# Patient Record
Sex: Male | Born: 1960 | Race: White | Hispanic: No | Marital: Single | Smoking: Former smoker
Health system: Southern US, Community
[De-identification: ages and names within clinical notes are randomized; demographics above are authoritative.]

## PROBLEM LIST (undated history)

## (undated) DIAGNOSIS — M1990 Unspecified osteoarthritis, unspecified site: Secondary | ICD-10-CM

## (undated) DIAGNOSIS — M069 Rheumatoid arthritis, unspecified: Secondary | ICD-10-CM

## (undated) DIAGNOSIS — I361 Nonrheumatic tricuspid (valve) insufficiency: Secondary | ICD-10-CM

## (undated) DIAGNOSIS — G608 Other hereditary and idiopathic neuropathies: Secondary | ICD-10-CM

## (undated) DIAGNOSIS — E785 Hyperlipidemia, unspecified: Secondary | ICD-10-CM

## (undated) DIAGNOSIS — Z87891 Personal history of nicotine dependence: Secondary | ICD-10-CM

## (undated) DIAGNOSIS — J449 Chronic obstructive pulmonary disease, unspecified: Secondary | ICD-10-CM

## (undated) DIAGNOSIS — F1210 Cannabis abuse, uncomplicated: Secondary | ICD-10-CM

## (undated) DIAGNOSIS — B182 Chronic viral hepatitis C: Secondary | ICD-10-CM

## (undated) DIAGNOSIS — F1411 Cocaine abuse, in remission: Secondary | ICD-10-CM

## (undated) DIAGNOSIS — I739 Peripheral vascular disease, unspecified: Secondary | ICD-10-CM

## (undated) DIAGNOSIS — I440 Atrioventricular block, first degree: Secondary | ICD-10-CM

## (undated) DIAGNOSIS — I10 Essential (primary) hypertension: Secondary | ICD-10-CM

## (undated) DIAGNOSIS — I779 Disorder of arteries and arterioles, unspecified: Secondary | ICD-10-CM

## (undated) DIAGNOSIS — I517 Cardiomegaly: Secondary | ICD-10-CM

## (undated) DIAGNOSIS — I6523 Occlusion and stenosis of bilateral carotid arteries: Secondary | ICD-10-CM

## (undated) DIAGNOSIS — R931 Abnormal findings on diagnostic imaging of heart and coronary circulation: Secondary | ICD-10-CM

## (undated) DIAGNOSIS — K219 Gastro-esophageal reflux disease without esophagitis: Secondary | ICD-10-CM

## (undated) DIAGNOSIS — F1910 Other psychoactive substance abuse, uncomplicated: Secondary | ICD-10-CM

## (undated) DIAGNOSIS — Z8619 Personal history of other infectious and parasitic diseases: Secondary | ICD-10-CM

## (undated) DIAGNOSIS — F1010 Alcohol abuse, uncomplicated: Secondary | ICD-10-CM

## (undated) DIAGNOSIS — E871 Hypo-osmolality and hyponatremia: Secondary | ICD-10-CM

## (undated) HISTORY — DX: Cardiomegaly: I51.7

## (undated) HISTORY — DX: Chronic viral hepatitis C: B18.2

## (undated) HISTORY — PX: ANKLE FRACTURE SURGERY: SHX122

## (undated) HISTORY — DX: Occlusion and stenosis of bilateral carotid arteries: I65.23

## (undated) HISTORY — DX: Peripheral vascular disease, unspecified: I73.9

## (undated) HISTORY — DX: Hyperlipidemia, unspecified: E78.5

## (undated) HISTORY — DX: Nonrheumatic tricuspid (valve) insufficiency: I36.1

## (undated) HISTORY — PX: NEPHRECTOMY: SHX65

## (undated) HISTORY — DX: Other hereditary and idiopathic neuropathies: G60.8

## (undated) HISTORY — PX: FEMUR FRACTURE SURGERY: SHX633

## (undated) HISTORY — DX: Abnormal findings on diagnostic imaging of heart and coronary circulation: R93.1

## (undated) HISTORY — DX: Atrioventricular block, first degree: I44.0

---

## 1998-03-21 DIAGNOSIS — Z905 Acquired absence of kidney: Secondary | ICD-10-CM

## 1998-03-21 HISTORY — DX: Acquired absence of kidney: Z90.5

## 2020-07-24 ENCOUNTER — Encounter: Payer: Self-pay | Admitting: Adult Health

## 2020-09-24 ENCOUNTER — Ambulatory Visit: Payer: BC Managed Care – PPO | Admitting: Infectious Diseases

## 2020-10-08 ENCOUNTER — Ambulatory Visit: Payer: BC Managed Care – PPO | Admitting: Infectious Diseases

## 2020-10-13 ENCOUNTER — Ambulatory Visit: Payer: BC Managed Care – PPO | Admitting: Infectious Diseases

## 2020-10-22 ENCOUNTER — Encounter: Payer: Self-pay | Admitting: Infectious Diseases

## 2020-10-22 ENCOUNTER — Ambulatory Visit: Payer: BC Managed Care – PPO | Attending: Infectious Diseases | Admitting: Infectious Diseases

## 2020-10-22 ENCOUNTER — Telehealth: Payer: Self-pay

## 2020-10-22 ENCOUNTER — Other Ambulatory Visit: Payer: Self-pay

## 2020-10-22 ENCOUNTER — Other Ambulatory Visit
Admission: RE | Admit: 2020-10-22 | Discharge: 2020-10-22 | Disposition: A | Discharge status: Home / Self Care | Payer: BC Managed Care – PPO | Source: Ambulatory Visit | Attending: Infectious Diseases | Admitting: Infectious Diseases

## 2020-10-22 VITALS — BP 167/92 | HR 71 | Ht 69.0 in | Wt 128.0 lb

## 2020-10-22 DIAGNOSIS — M069 Rheumatoid arthritis, unspecified: Secondary | ICD-10-CM | POA: Insufficient documentation

## 2020-10-22 DIAGNOSIS — I10 Essential (primary) hypertension: Secondary | ICD-10-CM | POA: Diagnosis not present

## 2020-10-22 DIAGNOSIS — Z7982 Long term (current) use of aspirin: Secondary | ICD-10-CM | POA: Insufficient documentation

## 2020-10-22 DIAGNOSIS — Z79899 Other long term (current) drug therapy: Secondary | ICD-10-CM | POA: Insufficient documentation

## 2020-10-22 DIAGNOSIS — F1010 Alcohol abuse, uncomplicated: Secondary | ICD-10-CM | POA: Diagnosis not present

## 2020-10-22 DIAGNOSIS — B182 Chronic viral hepatitis C: Secondary | ICD-10-CM | POA: Insufficient documentation

## 2020-10-22 DIAGNOSIS — M170 Bilateral primary osteoarthritis of knee: Secondary | ICD-10-CM | POA: Insufficient documentation

## 2020-10-22 DIAGNOSIS — Z8249 Family history of ischemic heart disease and other diseases of the circulatory system: Secondary | ICD-10-CM | POA: Insufficient documentation

## 2020-10-22 DIAGNOSIS — Z1329 Encounter for screening for other suspected endocrine disorder: Secondary | ICD-10-CM | POA: Diagnosis not present

## 2020-10-22 DIAGNOSIS — F17210 Nicotine dependence, cigarettes, uncomplicated: Secondary | ICD-10-CM | POA: Diagnosis not present

## 2020-10-22 LAB — HEPATITIS B CORE ANTIBODY, TOTAL: Hep B Core Total Ab: REACTIVE — AB

## 2020-10-22 LAB — COMPREHENSIVE METABOLIC PANEL
ALT: 31 U/L (ref 0–44)
AST: 63 U/L — ABNORMAL HIGH (ref 15–41)
Albumin: 4.3 g/dL (ref 3.5–5.0)
Alkaline Phosphatase: 88 U/L (ref 38–126)
Anion gap: 11 (ref 5–15)
BUN: <5 mg/dL — ABNORMAL LOW (ref 6–20)
CO2: 27 mmol/L (ref 22–32)
Calcium: 8.9 mg/dL (ref 8.9–10.3)
Chloride: 83 mmol/L — ABNORMAL LOW (ref 98–111)
Creatinine, Ser: 0.67 mg/dL (ref 0.61–1.24)
GFR, Estimated: >60 mL/min (ref >60)
Glucose, Bld: 99 mg/dL (ref 70–99)
Potassium: 3.9 mmol/L (ref 3.5–5.1)
Sodium: 121 mmol/L — ABNORMAL LOW (ref 135–145)
Total Bilirubin: 0.7 mg/dL (ref 0.3–1.2)
Total Protein: 7.7 g/dL (ref 6.5–8.1)

## 2020-10-22 LAB — HEPATITIS B SURFACE ANTIGEN: Hepatitis B Surface Ag: NONREACTIVE

## 2020-10-22 LAB — CBC WITH DIFFERENTIAL/PLATELET
Abs Immature Granulocytes: 0.03 10*3/uL (ref 0.00–0.07)
Basophils Absolute: 0 10*3/uL (ref 0.0–0.1)
Basophils Relative: 1 %
Eosinophils Absolute: 0.1 10*3/uL (ref 0.0–0.5)
Eosinophils Relative: 1 %
HCT: 36.3 % — ABNORMAL LOW (ref 39.0–52.0)
Hemoglobin: 13 g/dL (ref 13.0–17.0)
Immature Granulocytes: 0 %
Lymphocytes Relative: 20 %
Lymphs Abs: 1.4 10*3/uL (ref 0.7–4.0)
MCH: 33.3 pg (ref 26.0–34.0)
MCHC: 35.8 g/dL (ref 30.0–36.0)
MCV: 93.1 fL (ref 80.0–100.0)
Monocytes Absolute: 0.5 10*3/uL (ref 0.1–1.0)
Monocytes Relative: 6 %
Neutro Abs: 5 10*3/uL (ref 1.7–7.7)
Neutrophils Relative %: 72 %
Platelets: 281 10*3/uL (ref 150–400)
RBC: 3.9 MIL/uL — ABNORMAL LOW (ref 4.22–5.81)
RDW: 13.2 % (ref 11.5–15.5)
WBC: 7 10*3/uL (ref 4.0–10.5)
nRBC: 0 % (ref 0.0–0.2)

## 2020-10-22 LAB — TSH: TSH: 1.174 u[IU]/mL (ref 0.350–4.500)

## 2020-10-22 LAB — PROTIME-INR
INR: 0.9 (ref 0.8–1.2)
Prothrombin Time: 11.9 seconds (ref 11.4–15.2)

## 2020-10-22 LAB — HIV ANTIBODY (ROUTINE TESTING W REFLEX): HIV Screen 4th Generation wRfx: NONREACTIVE

## 2020-10-22 LAB — VITAMIN D 25 HYDROXY (VIT D DEFICIENCY, FRACTURES): Vit D, 25-Hydroxy: 38.87 ng/mL (ref 30–100)

## 2020-10-22 LAB — APTT: aPTT: 31 seconds (ref 24–36)

## 2020-10-22 NOTE — Patient Instructions (Signed)
You are here for treatment of hep C- today we will do a bunch of lab tests to look at the status of your liver and an ultrasound- depending on the test results will decide on treatment

## 2020-10-22 NOTE — Telephone Encounter (Signed)
Patient is scheduled for 9:30 am U/S of Liver on 11/09/20 and should arrive by 9:am. Advised this to daughter and went over location for [facility].

## 2020-10-22 NOTE — Progress Notes (Signed)
NAME: Sergio Gates  DOB: August 26, 1960  MRN: [id number]  Date/Time: 10/22/2020 9:37 AM  REQUESTING PROVIDER: Dr.Patel Subjective:  REASON FOR CONSULT: Hepc  ?Pt here with his daughter Sergio Gates is a 60 y.o. male with a history of Osteoarthritis, HTN, HEPC Is referred to me for HEPC Pt has h/o IVDA but clean for many years He has never been in medical care until recently when he got a PCP- preferred primary care. They referred him to rheumatology as he had severe knee joint pain and Rh factor was positive- he saw Dr.Patel who started him on plaquenil 200mg  QD and prednisone. 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 He has severe knee pain bilateral- He has fallen many times and hurt his sacrum- He continues to work.He was taking ibuprofen/day until his PCP told him to take tylenol which he takes upto 4 gram a day Pt drinks 12 cans of beer a day., current smoker, uses marijuana  PMH HTN OA Rheumatoid arthritis HEPC  PSH Rt femur and rt hip surgery   Social History   Socioeconomic History   Marital status: Single    Spouse name: Not on file   Number of children: Not on file   Years of education: Not on file   Highest education level: Not on file  Occupational History   Not on file  Tobacco Use   Smoking status: Every Day    Packs/day: 2.00    Years: 45.00    Pack years: 90.00    Types: Cigarettes   Smokeless tobacco: Never  Substance and Sexual Activity   Alcohol use: Yes    Alcohol/week: 12.0 standard drinks    Types: 12 Cans of beer per week    Comment: 12 PACK DAILY   Drug use: Yes    Types: Marijuana   Sexual activity: Not on file  Other Topics Concern   Not on file  Social History Narrative   Not on file   Social Determinants of Health   Financial Resource Strain: Not on file  Food Insecurity: Not on file  Transportation Needs: Not on file  Physical Activity: Not on file  Stress: Not on file  Social Connections: Not on file   Intimate Partner Violence: Not on file    FH HTN mother Kidney disease sister HTN brother  No Known Allergies I? Current Outpatient Medications  Medication Sig Dispense Refill   Amoxicill-Rifabutin-Omeprazole (TALICIA) 250-12.5-10 MG CPDR TAKE 4 CAPSULES BY MOUTH EVERY 8 HOURS     acetaminophen (TYLENOL) 500 MG tablet Take by mouth.     albuterol (VENTOLIN HFA) 108 (90 Base) MCG/ACT inhaler SMARTSIG:1-2 Puff(s) By Mouth Every 4-6 Hours PRN     ASPIRIN LOW DOSE 81 MG EC tablet Take 81 mg by mouth daily.     hydroxychloroquine (PLAQUENIL) 200 MG tablet Take 200 mg by mouth daily.     lisinopril (ZESTRIL) 10 MG tablet Take 10 mg by mouth daily.     metoprolol succinate (TOPROL-XL) 50 MG 24 hr tablet Take 50 mg by mouth daily.     ondansetron (ZOFRAN-ODT) 4 MG disintegrating tablet Take 4 mg by mouth every 4 (four) hours as needed.     TRELEGY ELLIPTA 100-62.5-25 MCG/INH AEPB Inhale 1 puff into the lungs daily.          REVIEW OF SYSTEMS:  Const: negative fever, negative chills, ++weight loss Eyes: negative diplopia or visual changes, negative eye pain ENT: negative coryza, negative sore throat Resp:  negative cough, hemoptysis, dyspnea Cards: negative for chest pain, palpitations, lower extremity edema GU: negative for frequency, dysuria and hematuria GI: Negative for abdominal pain, diarrhea, bleeding, constipation Skin: negative for rash and pruritus Heme: negative for easy bruising and gum/nose bleeding MS: b/l knee pain Neurolo:negative for headaches, dizziness, vertigo, memory problems  Psych: negative for feelings of anxiety, depression  Endocrine:  diabetes Allergy/Immunology- NKDA Objective:  VITALS:  BP (!) 167/92   Pulse 71   Ht 5\' 9"  (1.753 m)   Wt 128 lb (58.1 kg)   SpO2 97%   BMI 18.90 kg/m  PHYSICAL EXAM:  General: Alert, cooperative, in wheel chair, pale, chronically ill, emaciated Head: Normocephalic, without obvious abnormality, atraumatic. Eyes:  Conjunctivae clear, anicteric sclerae. Pupils are equal ENT Nares normal. No drainage or sinus tenderness. Lips, mucosa, and tongue normal. No Thrush Poor dentition Neck: Supple, symmetrical, no adenopathy, thyroid: non tender no carotid bruit and no JVD. Back: No CVA tenderness. Lungs: Clear to auscultation bilaterally. No Wheezing or Rhonchi. No rales. Heart: Regular rate and rhythm, no murmur, rub or gallop. Abdomen: Soft, non-tender,not distended. Bowel sounds normal. No masses Extremities: knock knees Bony swelling     Skin: No rashes or lesions. Or bruising Lymph: Cervical, supraclavicular normal. Neurologic: Grossly non-focal  Labs Tests result DATE comment  HEPC RNA     HEPC  Genotype     Hepatitis B profile     Hepatitis A status     PT/PTT     AST, ALT, Bilirubin     Albumin     creatinine     HB/Platelet     HIV     AFP     TSH     comorbidities      tobacco     alcohol     drug     APRI Score     FIB 4 score     Current meds     Ultrasound Elastography                ? Impression/Recommendation ? ?HEPC - without decompensation- will check labs including HEPc genotype, fibrosure, PT/PTT, cmp, cbc, ultrasound elastography  ETOH excess-counseled patient to reduce especially with HEPC risk for cirrhosis Also he takes 4 grams of tylenol/day- He will have to reduce that once he has intraarticular knee injections  Severe OA both knees- followed by ortho  Rheumatoid factor positive, CCP neg- on plaquenil -followed by Dr.Patel clinic  Smoker-  ? ___________________________________________________ Discussed with patient, and daughter Follow up after ultrasound Note:  This document was prepared using Dragon voice recognition software and may include unintentional dictation errors.

## 2020-10-23 LAB — AFP TUMOR MARKER: AFP, Serum, Tumor Marker: 1.6 ng/mL (ref 0.0–8.4)

## 2020-10-23 LAB — HEPATITIS B SURFACE ANTIBODY, QUANTITATIVE: Hep B S AB Quant (Post): <3.1 m[IU]/mL — ABNORMAL LOW (ref >9.9)

## 2020-10-24 LAB — HCV FIBROSURE
ALPHA 2-MACROGLOBULINS, QN: 342 mg/dL — ABNORMAL HIGH (ref 110–276)
ALT (SGPT) P5P: 37 IU/L (ref 0–55)
Apolipoprotein A-1: 166 mg/dL (ref 101–178)
Bilirubin, Total: 0.4 mg/dL (ref 0.0–1.2)
Fibrosis Score: 0.65 — ABNORMAL HIGH (ref 0.00–0.21)
GGT: 374 IU/L — ABNORMAL HIGH (ref 0–65)
Haptoglobin: 148 mg/dL (ref 29–370)
Necroinflammat Activity Score: 0.3 — ABNORMAL HIGH (ref 0.00–0.17)

## 2020-10-24 LAB — HCV RNA QUANT
HCV Quantitative Log: 6.072 log10 IU/mL (ref >1.70)
HCV Quantitative: 1180000 IU/mL (ref >50)

## 2020-11-02 LAB — HEPATITIS C GENOTYPE

## 2020-11-09 ENCOUNTER — Ambulatory Visit: Admission: RE | Admit: 2020-11-09 | Payer: BC Managed Care – PPO | Source: Ambulatory Visit

## 2020-11-12 ENCOUNTER — Encounter: Payer: Self-pay | Admitting: Infectious Diseases

## 2020-11-12 ENCOUNTER — Ambulatory Visit: Payer: BC Managed Care – PPO | Attending: Infectious Diseases | Admitting: Infectious Diseases

## 2020-11-12 ENCOUNTER — Other Ambulatory Visit: Payer: Self-pay

## 2020-11-12 ENCOUNTER — Other Ambulatory Visit (HOSPITAL_COMMUNITY): Payer: Self-pay

## 2020-11-12 VITALS — BP 175/94 | HR 76 | Resp 16 | Ht 69.0 in | Wt 128.0 lb

## 2020-11-12 DIAGNOSIS — F17210 Nicotine dependence, cigarettes, uncomplicated: Secondary | ICD-10-CM | POA: Insufficient documentation

## 2020-11-12 DIAGNOSIS — F1010 Alcohol abuse, uncomplicated: Secondary | ICD-10-CM | POA: Insufficient documentation

## 2020-11-12 DIAGNOSIS — I10 Essential (primary) hypertension: Secondary | ICD-10-CM | POA: Insufficient documentation

## 2020-11-12 DIAGNOSIS — Z79899 Other long term (current) drug therapy: Secondary | ICD-10-CM | POA: Diagnosis not present

## 2020-11-12 DIAGNOSIS — B1920 Unspecified viral hepatitis C without hepatic coma: Secondary | ICD-10-CM | POA: Diagnosis present

## 2020-11-12 DIAGNOSIS — F1911 Other psychoactive substance abuse, in remission: Secondary | ICD-10-CM | POA: Diagnosis not present

## 2020-11-12 DIAGNOSIS — M170 Bilateral primary osteoarthritis of knee: Secondary | ICD-10-CM | POA: Insufficient documentation

## 2020-11-12 DIAGNOSIS — B182 Chronic viral hepatitis C: Secondary | ICD-10-CM | POA: Diagnosis not present

## 2020-11-12 DIAGNOSIS — F1290 Cannabis use, unspecified, uncomplicated: Secondary | ICD-10-CM | POA: Insufficient documentation

## 2020-11-12 DIAGNOSIS — Z7982 Long term (current) use of aspirin: Secondary | ICD-10-CM | POA: Diagnosis not present

## 2020-11-12 DIAGNOSIS — Z8249 Family history of ischemic heart disease and other diseases of the circulatory system: Secondary | ICD-10-CM | POA: Diagnosis not present

## 2020-11-12 NOTE — Progress Notes (Signed)
NAME: Sergio Gates  DOB: [birth year]  MRN: [id number]  Date/Time: 11/12/2020 10:06 AM  Subjective:  Follow up visit for hepc last seen on 10/22/20 He had labs then and is here to discuss labs and eligibility for hepc treatment ?Pt here with his daughter Sergio Gates he has cut down on beer- 10 cans to 6 cans/day  Sergio Gates is a 59 y.o. male with a history of Osteoarthritis, HTN, HEPC Following tken from notes last visit Pt has h/o IVDA but clean for many years He has never been in medical care until recently when he got a PCP- preferred primary care. They referred him to rheumatology as he had severe knee joint pain and Rh factor was positive- he saw Sergio Gates who started him on plaquenil 200mg  QD and prednisone. 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 He has severe knee pain bilateral- He has fallen many times and hurt his sacrum- He continues to work.He was taking ibuprofen/day until his PCP told him to take tylenol which he takes upto 4 gram a day Pt drinks 12 cans of beer a day., current smoker, uses marijuana  PMH HTN OA Rheumatoid arthritis-  HEPC Treated Hpylori  PSH Rt femur and rt hip surgery   Social History   Socioeconomic History   Marital status: Single    Spouse name: Not on file   Number of children: Not on file   Years of education: Not on file   Highest education level: Not on file  Occupational History   Not on file  Tobacco Use   Smoking status: Every Day    Packs/day: 2.00    Years: 45.00    Pack years: 90.00    Types: Cigarettes   Smokeless tobacco: Never  Substance and Sexual Activity   Alcohol use: Yes    Alcohol/week: 12.0 standard drinks    Types: 12 Cans of beer per week    Comment: 12 PACK DAILY   Drug use: Yes    Types: Marijuana   Sexual activity: Not on file  Other Topics Concern   Not on file  Social History Narrative   Not on file   Social Determinants of Health   Financial Resource Strain: Not on file   Food Insecurity: Not on file  Transportation Needs: Not on file  Physical Activity: Not on file  Stress: Not on file  Social Connections: Not on file  Intimate Partner Violence: Not on file    FH HTN mother Kidney disease sister HTN brother  No Known Allergies  Current Outpatient Medications  Medication Sig Dispense Refill         acetaminophen (TYLENOL) 500 MG tablet Take by mouth.     albuterol (VENTOLIN HFA) 108 (90 Base) MCG/ACT inhaler SMARTSIG:1-2 Puff(s) By Mouth Every 4-6 Hours PRN     ASPIRIN LOW DOSE 81 MG EC tablet Take 81 mg by mouth daily.     hydroxychloroquine (PLAQUENIL) 200 MG tablet Take 200 mg by mouth daily.     lisinopril (ZESTRIL) 10 MG tablet Take 10 mg by mouth daily.     metoprolol succinate (TOPROL-XL) 50 MG 24 hr tablet Take 50 mg by mouth daily.     ondansetron (ZOFRAN-ODT) 4 MG disintegrating tablet Take 4 mg by mouth every 4 (four) hours as needed.     TRELEGY ELLIPTA 100-62.5-25 MCG/INH AEPB Inhale 1 puff into the lungs daily.      Says he has not been taking plaquenil  REVIEW OF SYSTEMS:  Const: negative fever, negative chills, ++weight loss Eyes: negative diplopia or visual changes, negative eye pain ENT: negative coryza, negative sore throat Resp: negative cough, hemoptysis, dyspnea Cards: negative for chest pain, palpitations, lower extremity edema GU: negative for frequency, dysuria and hematuria GI: Negative for abdominal pain, diarrhea, , constipation- has black stools for the past week since he started centrum which has iron in it Skin: negative for rash and pruritus Heme: negative for easy bruising and gum/nose bleeding MS: b/l knee pain Neurolo:negative for headaches, dizziness, vertigo, memory problems  Psych: negative for feelings of anxiety, depression  Endocrine:  diabetes Allergy/Immunology- NKDA Objective:  VITALS:  BP (!) 175/94   Pulse 76   Resp 16   Ht 5\' 9"  (1.753 m)   Wt 128 lb (58.1 kg)   SpO2 100%   BMI  18.90 kg/m  PHYSICAL EXAM:  General: Alert, cooperative, in wheel chair, malnoursihed Head: Normocephalic, without obvious abnormality, atraumatic. Eyes: Conjunctivae clear, anicteric sclerae. Pupils are equal ENT Nares normal. No drainage or sinus tenderness. Lips, mucosa, and tongue normal. No Thrush Poor dentition Neck: Supple, symmetrical, no adenopathy, thyroid: non tender no carotid bruit and no JVD. Back: No CVA tenderness. Lungs: Clear to auscultation bilaterally. No Wheezing or Rhonchi. No rales. Heart: Regular rate and rhythm, no murmur, rub or gallop. Abdomen: Soft, non-tender,not distended. Bowel sounds normal. No masses Extremities: knock knees Bony swelling  Skin: No rashes or lesions. Or bruising Lymph: Cervical, supraclavicular normal. Neurologic: Grossly non-focal  Labs Tests result DATE comment  HEPC RNA 1.67million 10/22/20   HEPC  Genotype 1a 10/22/20   Hepatitis B profile Surface antigen NR Antibody<3.1    Hepatitis A status     PT/PTT 11.9/31    AST, ALT, Bilirubin 63/31/0.7    Albumin 4.3    creatinine 0.67    HB/Platelet 13/281    HIV NR    AFP 1.6    TSH 1.174    comorbidities  Alcohol Tylenol    tobacco     alcohol 10 beers /day    drug     APRI Score 0.547  >0.7 predict fibrosis >1 cirrhosis  FIB 4 score 2.30  Stage 2-3 fibrosis  Current meds     Ultrasound Elastography     Fibrosure Fibrosis score 0.65  F3 Bridging fibrosis with many septa    Necroinflammatory score 0.30  Minimal activity   ? Impression/Recommendation ? 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 ___________________________________________________ Discussed with patient, and daughter If Ultrasound does not show cirrhosis will send prescription for Epclusa to [facility] pharmacy- will need PA Spoke to Pharmacist Note:  This document was prepared using Dragon voice recognition software and may include unintentional dictation errors.

## 2020-11-12 NOTE — Patient Instructions (Signed)
Today you are here for follow up for HEPC- once you have the ultrasound we can decide to send the prescription for EPCLUSA- recommend strongly to cut down on beer- 10> 6 >3. Will follow once you start treatment

## 2020-11-13 ENCOUNTER — Ambulatory Visit: Payer: BC Managed Care – PPO

## 2020-11-24 ENCOUNTER — Other Ambulatory Visit: Payer: Self-pay

## 2020-11-24 ENCOUNTER — Ambulatory Visit
Admission: RE | Admit: 2020-11-24 | Discharge: 2020-11-24 | Disposition: A | Discharge status: Home / Self Care | Payer: BC Managed Care – PPO | Source: Ambulatory Visit | Attending: Infectious Diseases | Admitting: Infectious Diseases

## 2020-11-24 DIAGNOSIS — B182 Chronic viral hepatitis C: Secondary | ICD-10-CM | POA: Insufficient documentation

## 2020-12-11 ENCOUNTER — Telehealth: Payer: Self-pay

## 2020-12-11 ENCOUNTER — Other Ambulatory Visit (HOSPITAL_COMMUNITY): Payer: Self-pay

## 2020-12-11 NOTE — Telephone Encounter (Signed)
[facility] Patient Advocate Encounter   Received notification from [facility] that prior authorization for Sergio Gates is required.   PA submitted on 12/11/20 Key [id number] Status is pending    [facility] will continue to follow.   Clearance Coots, CPhT Specialty Pharmacy Patient [facility] for Infectious Disease Phone: [phone] Fax:  [phone]

## 2020-12-14 ENCOUNTER — Other Ambulatory Visit (HOSPITAL_COMMUNITY): Payer: Self-pay

## 2020-12-15 ENCOUNTER — Telehealth: Payer: Self-pay

## 2020-12-15 ENCOUNTER — Other Ambulatory Visit (HOSPITAL_COMMUNITY): Payer: Self-pay

## 2020-12-15 NOTE — Telephone Encounter (Signed)
Spoke with Archie Patten, daughter of patient, who is his primary caretaker.   Patient is approved to receive Epclusa x 12 weeks for chronic Hepatitis C infection. Counseled patient to take Epclusa daily with or without food. Encouraged patient not to miss any doses and explained how their chance of cure could go down with each dose missed. Counseled patient on what to do if dose is missed - if it is closer to the missed dose take immediately; if closer to next dose skip dose and take the next dose at the usual time. Counseled patient on common side effects such as headache, fatigue, and nausea and that these normally decrease with time. I reviewed patient medications and found no drug interactions. Discussed with patient that there are several drug interactions including acid suppressants. Instructed patient to call clinic if he wishes to start a new medication during course of therapy. Also advised patient to call if he experiences any side effects. Patient will follow-up with me in the pharmacy clinic. Lupita Leash will be calling to set up appointments.   Shirlee More, PharmD PGY2 Infectious Diseases Pharmacy Resident

## 2020-12-15 NOTE — Telephone Encounter (Signed)
[facility] Patient Advocate Encounter  Prior Authorization for Sergio Gates has been approved.    PA# [id number] Effective dates: 12/11/20 through 06/08/21  Patients co-pay is $100.00.  Prescription can be filled at [facility].  I was able to get a copay card to make the copay $5.00.     [facility] will continue to follow.  Clearance Coots, CPhT Specialty Pharmacy Patient [facility] for Infectious Disease Phone: [phone] Fax:  [phone]

## 2020-12-15 NOTE — Telephone Encounter (Signed)
Spoke to [employer] in pharmacy at [facility], patient was approved yesterday and meds are being sent to [facility] then mailed to patient.

## 2020-12-16 ENCOUNTER — Other Ambulatory Visit: Payer: Self-pay | Admitting: Pharmacist

## 2020-12-16 ENCOUNTER — Other Ambulatory Visit (HOSPITAL_COMMUNITY): Payer: Self-pay

## 2020-12-16 DIAGNOSIS — B182 Chronic viral hepatitis C: Secondary | ICD-10-CM

## 2020-12-16 MED ORDER — SOFOSBUVIR-VELPATASVIR 400-100 MG PO TABS
1.0000 | ORAL_TABLET | Freq: Every day | ORAL | 2 refills | Status: DC
Start: 2020-12-16 — End: 2021-03-23
  Filled 2020-12-16 – 2020-12-17 (×2): qty 28, 28d supply, fill #0
  Filled 2021-01-07: qty 28, 28d supply, fill #1
  Filled 2021-02-08: qty 28, 28d supply, fill #2

## 2020-12-17 ENCOUNTER — Other Ambulatory Visit (HOSPITAL_COMMUNITY): Payer: Self-pay

## 2020-12-24 ENCOUNTER — Telehealth: Payer: Self-pay

## 2020-12-24 ENCOUNTER — Other Ambulatory Visit: Payer: Self-pay | Admitting: Adult Health

## 2020-12-24 DIAGNOSIS — W19XXXA Unspecified fall, initial encounter: Secondary | ICD-10-CM

## 2020-12-24 NOTE — Telephone Encounter (Signed)
Called to verify he rcvd meds for Hep C treatment. I spoke to daughter who states he is doing well and started meds Monday. Gave info on how to reach me while Dr Rivka Safer is out of country and scheduled follow up 01/21/2021 9:45 am.

## 2021-01-05 ENCOUNTER — Other Ambulatory Visit (HOSPITAL_COMMUNITY): Payer: Self-pay

## 2021-01-07 ENCOUNTER — Other Ambulatory Visit (HOSPITAL_COMMUNITY): Payer: Self-pay

## 2021-01-12 ENCOUNTER — Other Ambulatory Visit (HOSPITAL_COMMUNITY): Payer: Self-pay

## 2021-01-14 ENCOUNTER — Other Ambulatory Visit (HOSPITAL_COMMUNITY): Payer: Self-pay

## 2021-01-21 ENCOUNTER — Ambulatory Visit: Payer: BC Managed Care – PPO | Attending: Infectious Diseases | Admitting: Infectious Diseases

## 2021-01-21 ENCOUNTER — Encounter: Payer: Self-pay | Admitting: Infectious Diseases

## 2021-01-21 ENCOUNTER — Other Ambulatory Visit: Payer: Self-pay

## 2021-01-21 ENCOUNTER — Other Ambulatory Visit
Admission: RE | Admit: 2021-01-21 | Discharge: 2021-01-21 | Disposition: A | Discharge status: Home / Self Care | Payer: BC Managed Care – PPO | Source: Ambulatory Visit | Attending: Infectious Diseases | Admitting: Infectious Diseases

## 2021-01-21 VITALS — BP 185/97 | HR 79 | Resp 16 | Ht 69.0 in | Wt 133.0 lb

## 2021-01-21 DIAGNOSIS — Z7982 Long term (current) use of aspirin: Secondary | ICD-10-CM | POA: Insufficient documentation

## 2021-01-21 DIAGNOSIS — F17210 Nicotine dependence, cigarettes, uncomplicated: Secondary | ICD-10-CM | POA: Diagnosis not present

## 2021-01-21 DIAGNOSIS — M170 Bilateral primary osteoarthritis of knee: Secondary | ICD-10-CM | POA: Diagnosis not present

## 2021-01-21 DIAGNOSIS — F1010 Alcohol abuse, uncomplicated: Secondary | ICD-10-CM | POA: Insufficient documentation

## 2021-01-21 DIAGNOSIS — M069 Rheumatoid arthritis, unspecified: Secondary | ICD-10-CM | POA: Insufficient documentation

## 2021-01-21 DIAGNOSIS — B182 Chronic viral hepatitis C: Secondary | ICD-10-CM | POA: Diagnosis not present

## 2021-01-21 DIAGNOSIS — I10 Essential (primary) hypertension: Secondary | ICD-10-CM | POA: Insufficient documentation

## 2021-01-21 DIAGNOSIS — Z79899 Other long term (current) drug therapy: Secondary | ICD-10-CM | POA: Insufficient documentation

## 2021-01-21 LAB — COMPREHENSIVE METABOLIC PANEL
ALT: 12 U/L (ref 0–44)
AST: 25 U/L (ref 15–41)
Albumin: 3.8 g/dL (ref 3.5–5.0)
Alkaline Phosphatase: 58 U/L (ref 38–126)
Anion gap: 11 (ref 5–15)
BUN: 7 mg/dL (ref 6–20)
CO2: 26 mmol/L (ref 22–32)
Calcium: 8.9 mg/dL (ref 8.9–10.3)
Chloride: 95 mmol/L — ABNORMAL LOW (ref 98–111)
Creatinine, Ser: 0.79 mg/dL (ref 0.61–1.24)
GFR, Estimated: >60 mL/min (ref >60)
Glucose, Bld: 101 mg/dL — ABNORMAL HIGH (ref 70–99)
Potassium: 4.5 mmol/L (ref 3.5–5.1)
Sodium: 132 mmol/L — ABNORMAL LOW (ref 135–145)
Total Bilirubin: 0.7 mg/dL (ref 0.3–1.2)
Total Protein: 7.2 g/dL (ref 6.5–8.1)

## 2021-01-21 LAB — CBC WITH DIFFERENTIAL/PLATELET
Abs Immature Granulocytes: 0.04 10*3/uL (ref 0.00–0.07)
Basophils Absolute: 0 10*3/uL (ref 0.0–0.1)
Basophils Relative: 1 %
Eosinophils Absolute: 0.3 10*3/uL (ref 0.0–0.5)
Eosinophils Relative: 3 %
HCT: 34.6 % — ABNORMAL LOW (ref 39.0–52.0)
Hemoglobin: 11.9 g/dL — ABNORMAL LOW (ref 13.0–17.0)
Immature Granulocytes: 1 %
Lymphocytes Relative: 24 %
Lymphs Abs: 1.8 10*3/uL (ref 0.7–4.0)
MCH: 33.4 pg (ref 26.0–34.0)
MCHC: 34.4 g/dL (ref 30.0–36.0)
MCV: 97.2 fL (ref 80.0–100.0)
Monocytes Absolute: 0.7 10*3/uL (ref 0.1–1.0)
Monocytes Relative: 9 %
Neutro Abs: 4.7 10*3/uL (ref 1.7–7.7)
Neutrophils Relative %: 62 %
Platelets: 308 10*3/uL (ref 150–400)
RBC: 3.56 MIL/uL — ABNORMAL LOW (ref 4.22–5.81)
RDW: 12.2 % (ref 11.5–15.5)
WBC: 7.6 10*3/uL (ref 4.0–10.5)
nRBC: 0 % (ref 0.0–0.2)

## 2021-01-21 NOTE — Patient Instructions (Signed)
You are here for follow up of HEPC treatment- you are 6 weeks into treatment - today will do labs and after you complete treatment will check labs again

## 2021-01-21 NOTE — Progress Notes (Signed)
NAME: Sergio Gates  DOB: [birth year]  MRN: [id number]  Date/Time: 01/21/2021 10:04 AM  Subjective:  Follow up visit for hepc last seen on 11/12/20  ?Pt here with his daughter Has been taking epclusa 1 a day- into his 2nd bottle-100% adherent No side effects- no nausea, vomiting, no diarrhea, no pain abdomen Still drinking around 6 cans of beer a day- takes 5 tylenol/day for left knee pain Sergio Gates is a 60 y.o. male with a history of Osteoarthritis, HTN, HEPC Following taken from notes last visit Pt has h/o IVDA but clean for many years He has never been in medical care until recently when he got a PCP- preferred primary care. They referred him to rheumatology as he had severe knee joint pain and Rh factor was positive- he saw Dr.Patel who started him on plaquenil 200mg  QD and prednisone. 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 He has severe knee pain bilateral- He has fallen many times and hurt his sacrum- He continues to work.He was taking ibuprofen/day until his PCP told him to take tylenol which he takes upto 4 gram a day current smoker, uses marijuana  PMH HTN OA Rheumatoid arthritis-  HEPC Treated Hpylori  PSH Rt femur and rt hip surgery   Social History   Socioeconomic History   Marital status: Single    Spouse name: Not on file   Number of children: Not on file   Years of education: Not on file   Highest education level: Not on file  Occupational History   Not on file  Tobacco Use   Smoking status: Every Day    Packs/day: 2.00    Years: 45.00    Pack years: 90.00    Types: Cigarettes   Smokeless tobacco: Never  Substance and Sexual Activity   Alcohol use: Yes    Alcohol/week: 12.0 standard drinks    Types: 12 Cans of beer per week    Comment: 12 PACK DAILY   Drug use: Yes    Types: Marijuana   Sexual activity: Not on file  Other Topics Concern   Not on file  Social History Narrative   Not on file   Social Determinants  of Health   Financial Resource Strain: Not on file  Food Insecurity: Not on file  Transportation Needs: Not on file  Physical Activity: Not on file  Stress: Not on file  Social Connections: Not on file  Intimate Partner Violence: Not on file    FH HTN mother Kidney disease sister HTN brother  No Known Allergies  Current Outpatient Medications  Medication Sig Dispense Refill         acetaminophen (TYLENOL) 500 MG tablet Take by mouth.     albuterol (VENTOLIN HFA) 108 (90 Base) MCG/ACT inhaler SMARTSIG:1-2 Puff(s) By Mouth Every 4-6 Hours PRN     ASPIRIN LOW DOSE 81 MG EC tablet Take 81 mg by mouth daily.     hydroxychloroquine (PLAQUENIL) 200 MG tablet Take 200 mg by mouth daily.     lisinopril (ZESTRIL) 10 MG tablet Take 10 mg by mouth daily.     metoprolol succinate (TOPROL-XL) 50 MG 24 hr tablet Take 50 mg by mouth daily.     ondansetron (ZOFRAN-ODT) 4 MG disintegrating tablet Take 4 mg by mouth every 4 (four) hours as needed.     TRELEGY ELLIPTA 100-62.5-25 MCG/INH AEPB Inhale 1 puff into the lungs daily.      Says he has not been taking  plaquenil     REVIEW OF SYSTEMS:  Const: negative fever, negative chills, Eyes: negative diplopia or visual changes, negative eye pain ENT: negative coryza, negative sore throat Resp: negative cough, hemoptysis, dyspnea Cards: negative for chest pain, palpitations, lower extremity edema GU: negative for frequency, dysuria and hematuria GI: Negative for abdominal pain, diarrhea, , constipation-  Skin: negative for rash and pruritus Heme: negative for easy bruising and gum/nose bleeding MS: b/l knee pain Neurolo:negative for headaches, dizziness, vertigo, memory problems  Psych: negative for feelings of anxiety, depression  Endocrine:  diabetes Allergy/Immunology- NKDA Objective:  VITALS:  BP (!) 185/97   Pulse 79   Resp 16   Ht 5\' 9"  (1.753 m)   Wt 133 lb (60.3 kg)   SpO2 98%   BMI 19.64 kg/m  PHYSICAL EXAM:  General:  Alert, cooperative, walking ( was in wheel char before) knock knees Head: Normocephalic, without obvious abnormality, atraumatic. Eyes: Conjunctivae clear, anicteric sclerae. Pupils are equal ENT Nares normal. No drainage or sinus tenderness. Lips, mucosa, and tongue normal. No Thrush Poor dentition Neck: Supple, symmetrical, no adenopathy, thyroid: non tender no carotid bruit and no JVD. Back: No CVA tenderness. Lungs: Clear to auscultation bilaterally. No Wheezing or Rhonchi. No rales. Heart: Regular rate and rhythm, no murmur, rub or gallop. Abdomen: Soft, non-tender,not distended. Bowel sounds normal. No masses Extremities: knock knees Bony swelling  Skin: No rashes or lesions. Or bruising Lymph: Cervical, supraclavicular normal. Neurologic: Grossly non-focal  Labs Tests result DATE comment  HEPC RNA 1.53million 10/22/20   HEPC  Genotype 1a 10/22/20   Hepatitis B profile Surface antigen NR Antibody<3.1    Hepatitis A status     PT/PTT 11.9/31    AST, ALT, Bilirubin 63/31/0.7    Albumin 4.3    creatinine 0.67    HB/Platelet 13/281    HIV NR    AFP 1.6    TSH 1.174    comorbidities  Alcohol Tylenol    tobacco     alcohol 10 beers /day    drug     APRI Score 0.547  >0.7 predict fibrosis >1 cirrhosis  FIB 4 score 2.30  Stage 2-3 fibrosis  Current meds     Ultrasound Elastography     Fibrosure Fibrosis score 0.65  F3 Bridging fibrosis with many septa    Necroinflammatory score 0.30  Minimal activity   ? Impression/Recommendation ? ?HEPC -without decompensation- started epclusa 5 weeks ago 100% adherent, no side effetcs- today we will do labs CBC/CMP/RNA  ETOH excess-counseled patient to reduce especially with HEPC risk for cirrhosis- he says he has reduced to 6 cans and will try to reduce further Also he was taking  4 grams of tylenol/day-now it is2.5  grams   Severe OA both knees- followed by ortho Knee replacement apparently will be considered once he completes  HEPC  treatment  Rheumatoid factor positive, CCP neg- was on plaquenil but not taking it now -followed by Dr.Patel 06-25-1973 clinic  HTN on lisinopril His BP wa shigh today but he says it is due to knee pain  Smoker- counseled to quit- he is not ready ? ___________________________________________________ Discussed with patient, and daughter Follow up 8 weeks  Note:  This document was prepared using Dragon voice recognition software and may include unintentional dictation errors.

## 2021-01-22 LAB — HCV RNA QUANT: HCV Quantitative: NOT DETECTED IU/mL (ref >50)

## 2021-01-26 ENCOUNTER — Telehealth: Payer: Self-pay

## 2021-01-26 NOTE — Telephone Encounter (Signed)
Attempted to call and go over labs but mailbox is full on both phone numbers and daughters cell. Wil try again later. Need to advise that his labs look good and liver looking great. Hep C was not detected but he does still need to finish all his medications per Dr Merry Lofty. He does show a little bit anemic. Will eep trying to reach patient.

## 2021-02-08 ENCOUNTER — Other Ambulatory Visit (HOSPITAL_COMMUNITY): Payer: Self-pay

## 2021-02-09 ENCOUNTER — Other Ambulatory Visit (HOSPITAL_COMMUNITY): Payer: Self-pay

## 2021-03-08 ENCOUNTER — Other Ambulatory Visit (HOSPITAL_COMMUNITY): Payer: Self-pay

## 2021-03-17 ENCOUNTER — Other Ambulatory Visit (HOSPITAL_COMMUNITY): Payer: Self-pay

## 2021-03-23 ENCOUNTER — Encounter: Payer: Self-pay | Admitting: Infectious Diseases

## 2021-03-23 ENCOUNTER — Other Ambulatory Visit: Payer: Self-pay

## 2021-03-23 ENCOUNTER — Other Ambulatory Visit
Admission: RE | Admit: 2021-03-23 | Discharge: 2021-03-23 | Disposition: A | Discharge status: Home / Self Care | Payer: BC Managed Care – PPO | Attending: Infectious Diseases | Admitting: Infectious Diseases

## 2021-03-23 ENCOUNTER — Ambulatory Visit: Payer: BC Managed Care – PPO | Attending: Infectious Diseases | Admitting: Infectious Diseases

## 2021-03-23 VITALS — BP 119/72 | HR 75 | Resp 16 | Ht 69.0 in | Wt 126.0 lb

## 2021-03-23 DIAGNOSIS — M170 Bilateral primary osteoarthritis of knee: Secondary | ICD-10-CM | POA: Diagnosis not present

## 2021-03-23 DIAGNOSIS — M069 Rheumatoid arthritis, unspecified: Secondary | ICD-10-CM | POA: Diagnosis not present

## 2021-03-23 DIAGNOSIS — B182 Chronic viral hepatitis C: Secondary | ICD-10-CM | POA: Insufficient documentation

## 2021-03-23 DIAGNOSIS — Z79899 Other long term (current) drug therapy: Secondary | ICD-10-CM | POA: Diagnosis not present

## 2021-03-23 DIAGNOSIS — I10 Essential (primary) hypertension: Secondary | ICD-10-CM | POA: Diagnosis not present

## 2021-03-23 LAB — COMPREHENSIVE METABOLIC PANEL
ALT: 13 U/L (ref 0–44)
AST: 28 U/L (ref 15–41)
Albumin: 3.9 g/dL (ref 3.5–5.0)
Alkaline Phosphatase: 62 U/L (ref 38–126)
Anion gap: 7 (ref 5–15)
BUN: 10 mg/dL (ref 6–20)
CO2: 26 mmol/L (ref 22–32)
Calcium: 8.9 mg/dL (ref 8.9–10.3)
Chloride: 96 mmol/L — ABNORMAL LOW (ref 98–111)
Creatinine, Ser: 0.85 mg/dL (ref 0.61–1.24)
GFR, Estimated: >60 mL/min (ref >60)
Glucose, Bld: 91 mg/dL (ref 70–99)
Potassium: 3.8 mmol/L (ref 3.5–5.1)
Sodium: 129 mmol/L — ABNORMAL LOW (ref 135–145)
Total Bilirubin: 0.6 mg/dL (ref 0.3–1.2)
Total Protein: 7.1 g/dL (ref 6.5–8.1)

## 2021-03-23 NOTE — Progress Notes (Signed)
NAME: Sergio Gates  DOB: [birth year]  MRN: [id number]  Date/Time: 03/23/2021 9:25 AM  Subjective:  Follow up visit for hepc last seen Nov 2023  Sergio Gates is a 61 y.o. male with a history of Osteoarthritis, HTN, HEPC ?Pt here with his daughter Pt has completed 12 weeks of HEPC treatment with [facility] Doing well Last HEPC RNA after a month on RX was undetectable Has positive rheumatoid factor and arthritis and on Humira- followed by Dr.Patel at [facility] clinic Is seeing ortho for possible knee replacements  PMH HTN OA Rheumatoid arthritis-  HEPC Treated Hpylori  PSH Rt femur and rt hip surgery   Social History   Socioeconomic History   Marital status: Single    Spouse name: Not on file   Number of children: Not on file   Years of education: Not on file   Highest education level: Not on file  Occupational History   Not on file  Tobacco Use   Smoking status: Every Day    Packs/day: 2.00    Years: 45.00    Pack years: 90.00    Types: Cigarettes   Smokeless tobacco: Never  Substance and Sexual Activity   Alcohol use: Yes    Alcohol/week: 12.0 standard drinks    Types: 12 Cans of beer per week    Comment: 12 PACK DAILY   Drug use: Yes    Types: Marijuana   Sexual activity: Not on file  Other Topics Concern   Not on file  Social History Narrative   Not on file   Social Determinants of Health   Financial Resource Strain: Not on file  Food Insecurity: Not on file  Transportation Needs: Not on file  Physical Activity: Not on file  Stress: Not on file  Social Connections: Not on file  Intimate Partner Violence: Not on file    FH HTN mother Kidney disease sister HTN brother  No Known Allergies  Current Outpatient Medications  Medication Sig Dispense Refill         acetaminophen (TYLENOL) 500 MG tablet Take by mouth.     albuterol (VENTOLIN HFA) 108 (90 Base) MCG/ACT inhaler SMARTSIG:1-2 Puff(s) By Mouth Every 4-6 Hours PRN     ASPIRIN LOW DOSE 81 MG  EC tablet Take 81 mg by mouth daily.     hydroxychloroquine (PLAQUENIL) 200 MG tablet Take 200 mg by mouth daily.     lisinopril (ZESTRIL) 10 MG tablet Take 10 mg by mouth daily.     metoprolol succinate (TOPROL-XL) 50 MG 24 hr tablet Take 50 mg by mouth daily.     ondansetron (ZOFRAN-ODT) 4 MG disintegrating tablet Take 4 mg by mouth every 4 (four) hours as needed.     TRELEGY ELLIPTA 100-62.5-25 MCG/INH AEPB Inhale 1 puff into the lungs daily.      REVIEW OF SYSTEMS:  Const: negative fever, negative chills, Eyes: negative diplopia or visual changes, negative eye pain ENT: negative coryza, negative sore throat Resp: negative cough, hemoptysis, dyspnea Cards: negative for chest pain, palpitations, lower extremity edema GU: negative for frequency, dysuria and hematuria GI: Negative for abdominal pain, diarrhea, , constipation-  Skin: negative for rash and pruritus Heme: negative for easy bruising and gum/nose bleeding MS: b/l knee pain Neurolo:negative for headaches, dizziness, vertigo, memory problems  Psych: negative for feelings of anxiety, depression  Endocrine:  diabetes Allergy/Immunology- NKDA Objective:  VITALS:  BP 119/72    Pulse 75    Resp 16    Ht 5\' 9"  (1.753 m)  Wt 126 lb (57.2 kg)    SpO2 95%    BMI 18.61 kg/m  PHYSICAL EXAM:  General: Alert, cooperative, walking  knock knees Head: Normocephalic, without obvious abnormality, atraumatic. Eyes: Conjunctivae clear, anicteric sclerae. Pupils are equal ENT Nares normal. No drainage or sinus tenderness. Lips, mucosa, and tongue normal. No Thrush Poor dentition Neck: Supple, symmetrical, no adenopathy, thyroid: non tender no carotid bruit and no JVD. Back: No CVA tenderness. Lungs: Clear to auscultation bilaterally. No Wheezing or Rhonchi. No rales. Heart: Regular rate and rhythm, no murmur, rub or gallop. Abdomen: Soft, non-tender,not distended. Bowel sounds normal. No masses Extremities: knock knees Bony  swelling  Skin: No rashes or lesions. Or bruising Lymph: Cervical, supraclavicular normal. Neurologic: Grossly non-focal  Labs Tests result DATE comment  HEPC RNA 1.80million 10/22/20   HEPC  Genotype 1a 10/22/20   Hepatitis B profile Surface antigen NR Antibody<3.1    Hepatitis A status     PT/PTT 11.9/31    AST, ALT, Bilirubin 63/31/0.7    Albumin 4.3    creatinine 0.67    HB/Platelet 13/281    HIV NR    AFP 1.6    TSH 1.174    comorbidities  Alcohol Tylenol    tobacco     alcohol 10 beers /day    drug     APRI Score 0.547  >0.7 predict fibrosis >1 cirrhosis  FIB 4 score 2.30  Stage 2-3 fibrosis  Current meds     Ultrasound Elastography     Fibrosure Fibrosis score 0.65  F3 Bridging fibrosis with many septa    Necroinflammatory score 0.30  Minimal activity   ? Impression/Recommendation ? ?HEPC -without decompensation- completed 12 weeks of treatment with [facility] a few days ago Will get HEPC RNA and LFTS today  HEPB Core antibody positive but negative surface antigen and antibody- will do DNA- if neg then can vaccinate him .  ETOH has reduced beer consumption  Severe OA both knees- followed by ortho Knee replacement apparently will be considered once he completes HEPC  treatment  Rheumatoid factor positive, CCP neg- on Humira -followed [facility]  HTN on lisinopril  Discussed the management with patient and his daughter ? __________________________________________________ Discussed with patient, and daughter Follow up 6 months

## 2021-03-23 NOTE — Patient Instructions (Signed)
You have completed treatment for hepaittis c- today we will do labs- we will also check labs for HEPB DNA as the core antibody alone is positive- IF the DNA is not present then we can start the vaccination for HEPB for you Follow 6 months

## 2021-03-24 LAB — HCV RNA QUANT: HCV Quantitative: NOT DETECTED IU/mL (ref >50)

## 2021-03-24 LAB — HEPATITIS B DNA, ULTRAQUANTITATIVE, PCR
HBV DNA SERPL PCR-ACNC: NOT DETECTED IU/mL
HBV DNA SERPL PCR-LOG IU: UNDETERMINED log10 IU/mL

## 2021-03-26 ENCOUNTER — Telehealth: Payer: Self-pay

## 2021-03-26 NOTE — Telephone Encounter (Signed)
Spoke to daughter and advised Hep C undetectable. Also advise dhe will need Hep B vaccines as he had no antibodies.Will start the series at next visit. I did warn her that his sodium is low and that may be due to the beer. Understands results and will call if any issues arise before next visit.

## 2021-09-23 ENCOUNTER — Encounter: Payer: Self-pay | Admitting: Infectious Diseases

## 2021-09-23 ENCOUNTER — Other Ambulatory Visit
Admission: RE | Admit: 2021-09-23 | Discharge: 2021-09-23 | Disposition: A | Discharge status: Home / Self Care | Payer: BC Managed Care – PPO | Source: Ambulatory Visit | Attending: Infectious Diseases | Admitting: Infectious Diseases

## 2021-09-23 ENCOUNTER — Ambulatory Visit: Payer: BC Managed Care – PPO | Attending: Infectious Diseases | Admitting: Infectious Diseases

## 2021-09-23 VITALS — BP 171/91 | HR 68 | Temp 97.9°F | Ht 69.0 in | Wt 131.0 lb

## 2021-09-23 DIAGNOSIS — B182 Chronic viral hepatitis C: Secondary | ICD-10-CM | POA: Insufficient documentation

## 2021-09-23 DIAGNOSIS — Z79899 Other long term (current) drug therapy: Secondary | ICD-10-CM | POA: Diagnosis not present

## 2021-09-23 DIAGNOSIS — B1920 Unspecified viral hepatitis C without hepatic coma: Secondary | ICD-10-CM | POA: Diagnosis present

## 2021-09-23 DIAGNOSIS — M069 Rheumatoid arthritis, unspecified: Secondary | ICD-10-CM | POA: Diagnosis not present

## 2021-09-23 DIAGNOSIS — I10 Essential (primary) hypertension: Secondary | ICD-10-CM | POA: Diagnosis not present

## 2021-09-23 DIAGNOSIS — Z23 Encounter for immunization: Secondary | ICD-10-CM

## 2021-09-23 DIAGNOSIS — M170 Bilateral primary osteoarthritis of knee: Secondary | ICD-10-CM | POA: Diagnosis not present

## 2021-09-23 LAB — COMPREHENSIVE METABOLIC PANEL
ALT: 11 U/L (ref 0–44)
AST: 28 U/L (ref 15–41)
Albumin: 3.9 g/dL (ref 3.5–5.0)
Alkaline Phosphatase: 91 U/L (ref 38–126)
Anion gap: 8 (ref 5–15)
BUN: 6 mg/dL (ref 6–20)
CO2: 28 mmol/L (ref 22–32)
Calcium: 9.1 mg/dL (ref 8.9–10.3)
Chloride: 95 mmol/L — ABNORMAL LOW (ref 98–111)
Creatinine, Ser: 0.85 mg/dL (ref 0.61–1.24)
GFR, Estimated: >60 mL/min (ref >60)
Glucose, Bld: 97 mg/dL (ref 70–99)
Potassium: 4.8 mmol/L (ref 3.5–5.1)
Sodium: 131 mmol/L — ABNORMAL LOW (ref 135–145)
Total Bilirubin: 0.7 mg/dL (ref 0.3–1.2)
Total Protein: 7.2 g/dL (ref 6.5–8.1)

## 2021-09-23 LAB — CBC WITH DIFFERENTIAL/PLATELET
Abs Immature Granulocytes: 0.02 10*3/uL (ref 0.00–0.07)
Basophils Absolute: 0 10*3/uL (ref 0.0–0.1)
Basophils Relative: 0 %
Eosinophils Absolute: 0.3 10*3/uL (ref 0.0–0.5)
Eosinophils Relative: 4 %
HCT: 40.7 % (ref 39.0–52.0)
Hemoglobin: 13.8 g/dL (ref 13.0–17.0)
Immature Granulocytes: 0 %
Lymphocytes Relative: 21 %
Lymphs Abs: 1.5 10*3/uL (ref 0.7–4.0)
MCH: 31.4 pg (ref 26.0–34.0)
MCHC: 33.9 g/dL (ref 30.0–36.0)
MCV: 92.7 fL (ref 80.0–100.0)
Monocytes Absolute: 0.8 10*3/uL (ref 0.1–1.0)
Monocytes Relative: 11 %
Neutro Abs: 4.5 10*3/uL (ref 1.7–7.7)
Neutrophils Relative %: 64 %
Platelets: 302 10*3/uL (ref 150–400)
RBC: 4.39 MIL/uL (ref 4.22–5.81)
RDW: 13 % (ref 11.5–15.5)
WBC: 7.1 10*3/uL (ref 4.0–10.5)
nRBC: 0 % (ref 0.0–0.2)

## 2021-09-23 LAB — HEPATITIS A ANTIBODY, TOTAL: hep A Total Ab: NONREACTIVE

## 2021-09-23 NOTE — Progress Notes (Signed)
NAME: Sergio Gates  DOB: [birth year]  MRN: [id number]  Date/Time: 09/23/2021 10:06 AM  Subjective:  Follow up visit for hepc Patient is here with his daughter  Sergio Gates is a 61 y.o. male with a history of Osteoarthritis, HTN, HEPC, patient completed hep C treatment in November 2022 and is here for follow-up.  He has undetectable viral load few months ago. He is awaiting knee replacement Has cut down on alcohol and smoking   PMH HTN OA Rheumatoid arthritis-  HEPC Treated Hpylori  PSH Rt femur and rt hip surgery   Social History   Socioeconomic History   Marital status: Single    Spouse name: Not on file   Number of children: Not on file   Years of education: Not on file   Highest education level: Not on file  Occupational History   Not on file  Tobacco Use   Smoking status: Every Day    Packs/day: 2.00    Years: 45.00    Total pack years: 90.00    Types: Cigarettes   Smokeless tobacco: Never  Substance and Sexual Activity   Alcohol use: Yes    Alcohol/week: 12.0 standard drinks of alcohol    Types: 12 Cans of beer per week    Comment: 12 PACK DAILY   Drug use: Yes    Types: Marijuana   Sexual activity: Not on file  Other Topics Concern   Not on file  Social History Narrative   Not on file   Social Determinants of Health   Financial Resource Strain: Not on file  Food Insecurity: Not on file  Transportation Needs: Not on file  Physical Activity: Not on file  Stress: Not on file  Social Connections: Not on file  Intimate Partner Violence: Not on file  t here with his daughter   FH HTN mother Kidney disease sister HTN brother  No Known Allergies  Current Outpatient Medications  Medication Sig Dispense Refill         acetaminophen (TYLENOL) 500 MG tablet Take by mouth.     albuterol (VENTOLIN HFA) 108 (90 Base) MCG/ACT inhaler SMARTSIG:1-2 Puff(s) By Mouth Every 4-6 Hours PRN     ASPIRIN LOW DOSE 81 MG EC tablet Take 81 mg by mouth daily.      hydroxychloroquine (PLAQUENIL) 200 MG tablet Take 200 mg by mouth daily.     lisinopril (ZESTRIL) 10 MG tablet Take 10 mg by mouth daily.     metoprolol succinate (TOPROL-XL) 50 MG 24 hr tablet Take 50 mg by mouth daily.     ondansetron (ZOFRAN-ODT) 4 MG disintegrating tablet Take 4 mg by mouth every 4 (four) hours as needed.     TRELEGY ELLIPTA 100-62.5-25 MCG/INH AEPB Inhale 1 puff into the lungs daily.      REVIEW OF SYSTEMS:  Const: negative fever, negative chills, Eyes: negative diplopia or visual changes, negative eye pain ENT: negative coryza, negative sore throat Resp: negative cough, hemoptysis, dyspnea Cards: negative for chest pain, palpitations, lower extremity edema GU: negative for frequency, dysuria and hematuria GI: Negative for abdominal pain, diarrhea, , constipation-  Skin: negative for rash and pruritus Heme: negative for easy bruising and gum/nose bleeding MS: b/l knee pain Neurolo:negative for headaches, dizziness, vertigo, memory problems  Psych: negative for feelings of anxiety, depression  Endocrine:  diabetes Allergy/Immunology- NKDA Objective:  VITALS:  BP (!) 171/91   Pulse 68   Temp 97.9 F (36.6 C) (Temporal)   Ht 5\' 9"  (1.753 m)  Wt 131 lb (59.4 kg)   BMI 19.35 kg/m  PHYSICAL EXAM:  General: Alert, well  Head: Normocephalic, without obvious abnormality, atraumatic. Eyes: Conjunctivae clear, anicteric sclerae. Pupils are equal ENT Nares normal. No drainage or sinus tenderness. Lips, mucosa, and tongue normal. No Thrush Poor dentition Neck: Supple, symmetrical, no adenopathy, thyroid: non tender no carotid bruit and no JVD. Back: No CVA tenderness. Lungs: Clear to auscultation bilaterally. No Wheezing or Rhonchi. No rales. Heart: Regular rate and rhythm, no murmur, rub or gallop. Abdomen: Soft, non-tender,not distended. Bowel sounds normal. No masses Extremities: knock knees Bony swelling  Skin: No rashes or lesions. Or bruising Lymph:  Cervical, supraclavicular normal. Neurologic: Grossly non-focal  Labs Tests result DATE comment  HEPC RNA 1.65million Undetectable 10/22/20 January 2023   HEPC  Genotype 1a 10/22/20   Hepatitis B profile Surface antigen NR Antibody<3.1    Hepatitis A status     PT/PTT 11.9/31    AST, ALT, Bilirubin 63/31/0.7    Albumin 4.3    creatinine 0.67    HB/Platelet 13/281    HIV NR    AFP 1.6    TSH 1.174    comorbidities  Alcohol Tylenol    tobacco     alcohol 10 beers /day    drug     APRI Score 0.547  >0.7 predict fibrosis >1 cirrhosis  FIB 4 score 2.30  Stage 2-3 fibrosis  Current meds     Ultrasound Elastography     Fibrosure Fibrosis score 0.65  F3 Bridging fibrosis with many septa    Necroinflammatory score 0.30  Minimal activity   ? Impression/Recommendation ? ?HEPC -without decompensation- completed 12 weeks of treatment with [facility] November 2022. will check hepatitis C RNA to see whether he has sustained virological response.  HEPB Core antibody positive but negative surface antigen and antibody-and DNA.  Hence will start vaccinating him today .  ETOH has reduced beer consumption  Severe OA both knees- followed by ortho Knee replacement in the cards.    Rheumatoid factor positive, CCP neg- on Humira -followed by Dr[facility]el [facility]  HTN on lisinopril  Discussed the management with patient and his daughter ? __________________________________________________ Discussed with patient, and daughter Follow up 1 month for second dose of hepatitis B vaccine

## 2021-09-23 NOTE — Patient Instructions (Signed)
You are here for follow up of HEPC - you completed treatment 6 months ago- will check labs today including AFP, hepc rna and cmp. Will give hepb vaccine today. Follow up 1 month for the 2 nd dose

## 2021-09-24 LAB — HCV RNA QUANT: HCV Quantitative: NOT DETECTED IU/mL (ref >50)

## 2021-09-24 LAB — AFP TUMOR MARKER: AFP, Serum, Tumor Marker: <1.8 ng/mL (ref 0.0–8.4)

## 2021-09-28 ENCOUNTER — Telehealth: Payer: Self-pay

## 2021-09-28 NOTE — Telephone Encounter (Signed)
-----   Message from Lynn Ito, MD sent at 09/28/2021  3:29 PM EDT ----- Please let him know HEPC virus is undetectable 6 months after completion of treatment- thx ----- Message ----- From: Interface, Lab In [place] Sent: 09/23/2021  11:07 AM EDT To: Lynn Ito, MD

## 2021-09-28 NOTE — Telephone Encounter (Signed)
Patient's daughter advised that patient's Hep C is undetectable since completing treatment. Patient's daughter will also let the patient know. Kevon Tench T Pricilla Loveless

## 2021-10-11 IMAGING — US US LIVER ELASTOGRAPHY
1 series · 12 of 25 positions shown · non-contrast
Comparison: None.

CLINICAL DATA: Chronic hepatitis C

EXAM:
US LIVER ELASTOGRAPHY
TECHNIQUE: Sonography of the liver was performed. In addition, ultrasound
elastography evaluation of the liver was performed. A region of
interest was placed within the right lobe of the liver. Following
application of a compressive sonographic pulse, tissue
compressibility was assessed. Multiple assessments were performed at
the selected site. Median tissue compressibility was determined.
Previously, hepatic stiffness was assessed by shear wave velocity.
Based on recently published Society of Radiologists in Ultrasound
consensus article, reporting is now recommended to be performed in
the SI units of pressure (kiloPascals) representing hepatic
stiffness/elasticity. The obtained result is compared to the
published reference standards. (cACLD = compensated Advanced Chronic
Liver Disease)

[Series 1: us elastography liver · 12 of 37 slices shown]
[im 2/37]
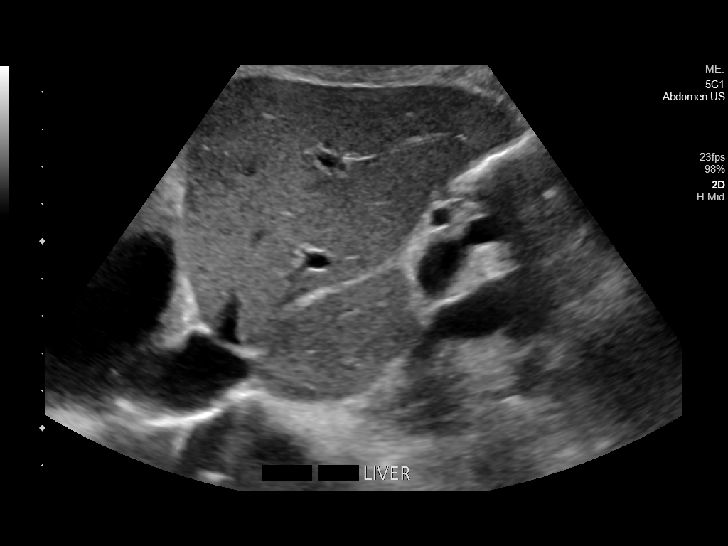
[im 5/37]
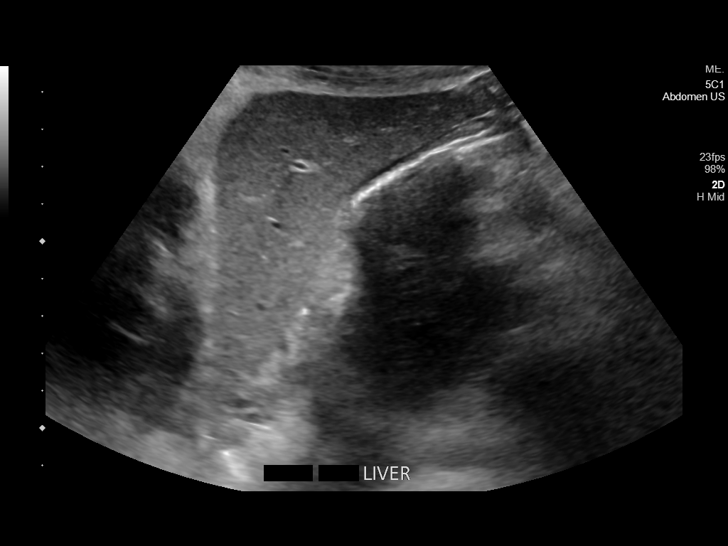
[im 8/37]
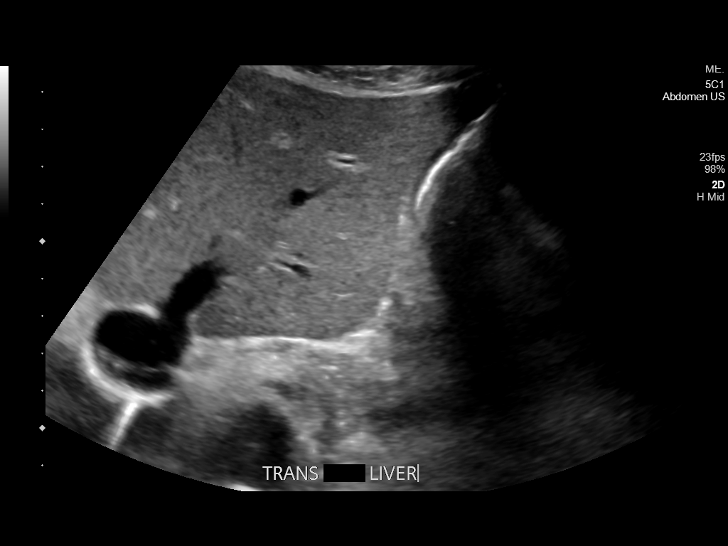
[im 11/37]
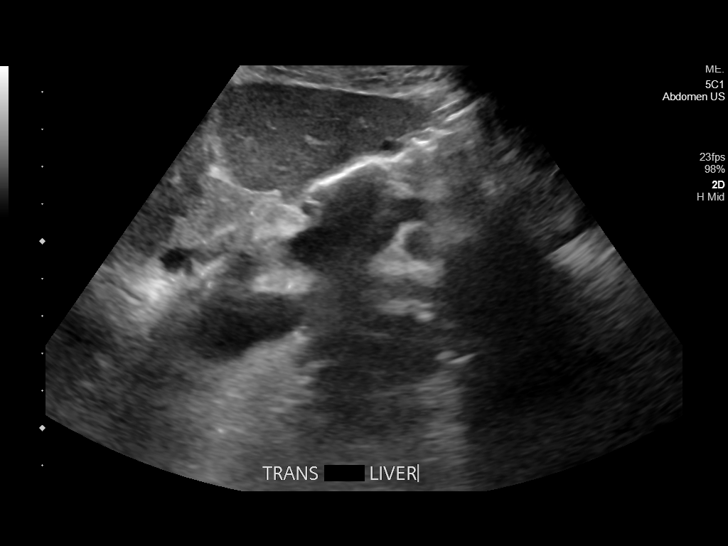
[im 14/37]
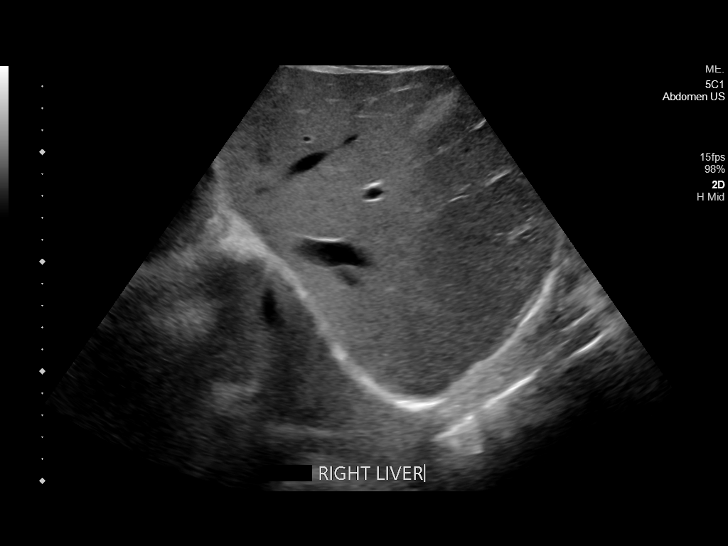
[im 17/37]
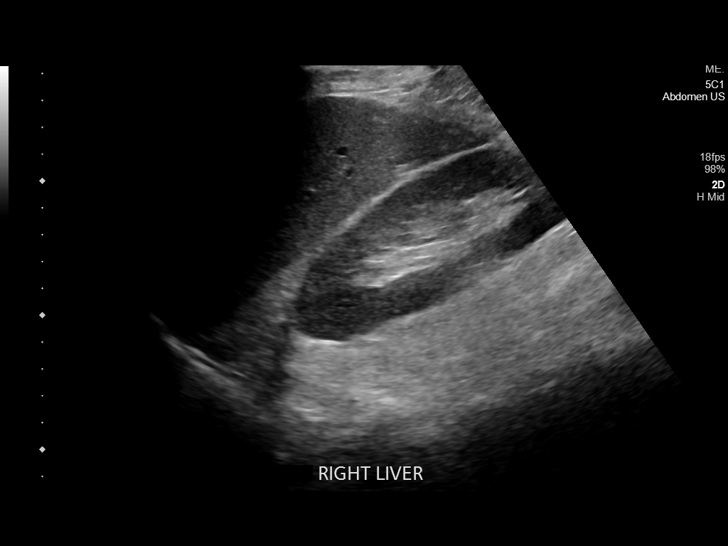
[im 20/37]
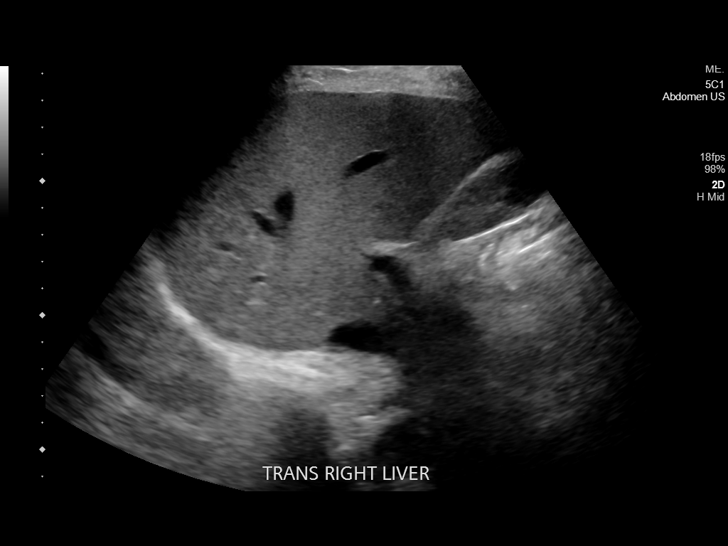
[im 23/37]
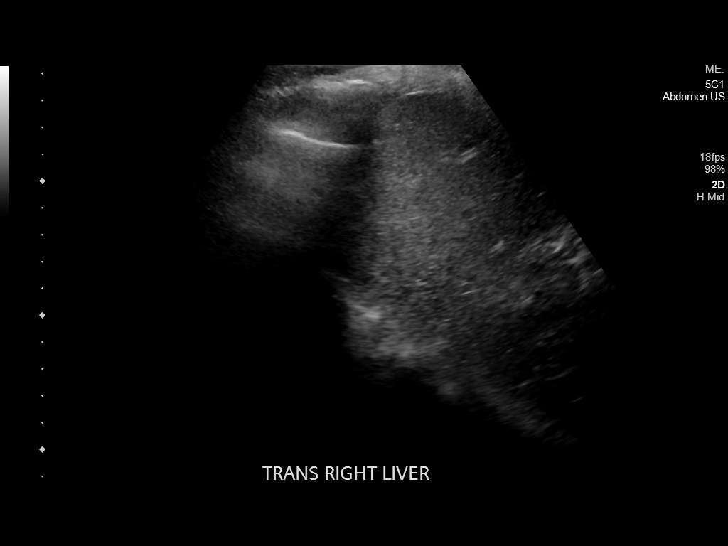
[im 26/37]
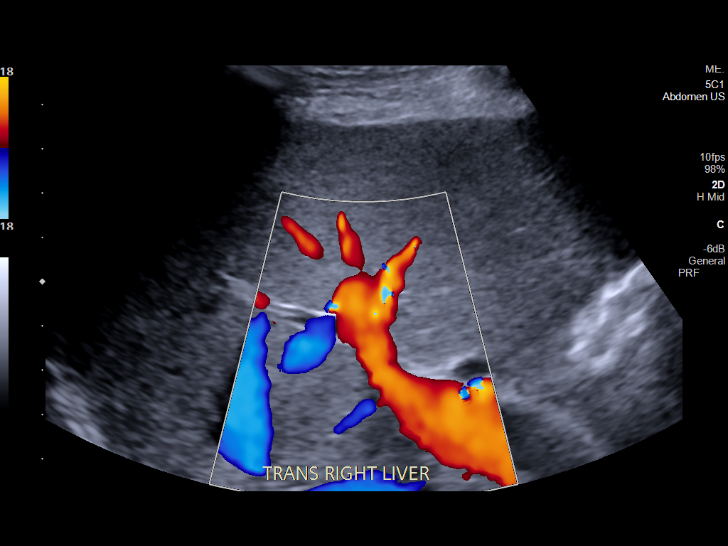
[im 29/37]
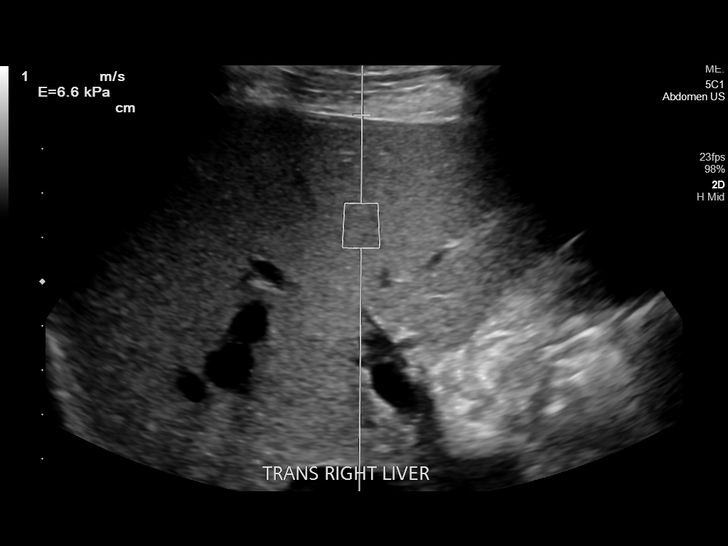
[im 32/37]
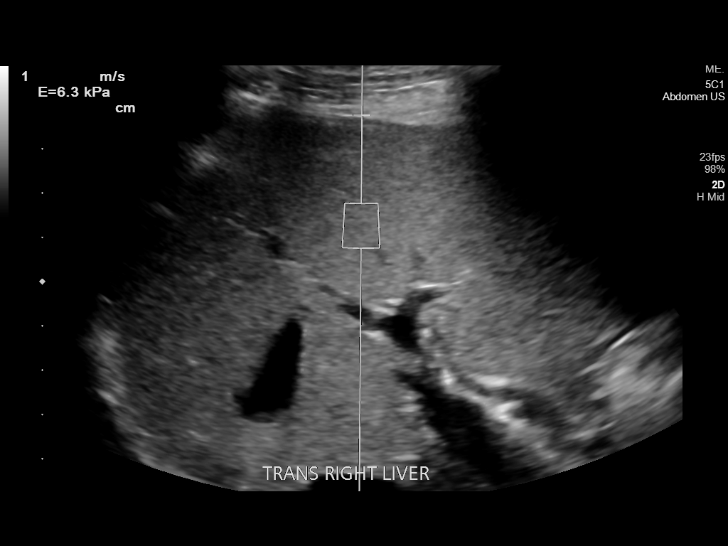
[im 35/37]
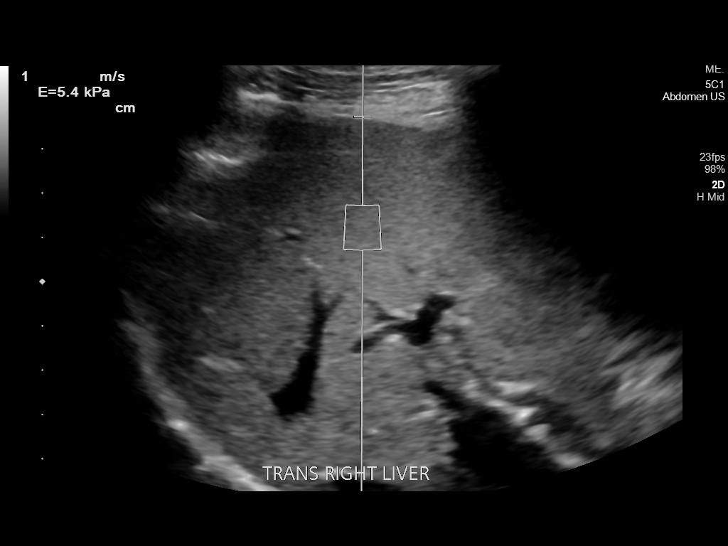

[12 of 25 positions shown; findings below may reference images not displayed]

FINDINGS: Liver: No focal lesion identified. Diffusely increased parenchymal
echogenicity. Portal vein is patent on color Doppler imaging with
normal direction of blood flow towards the liver.

ULTRASOUND HEPATIC ELASTOGRAPHY

Device: Siemens Helix VTQ

Patient position: Supine

Transducer: [id number]

Number of measurements: 10

Hepatic segment:  8

Median kPa:

IQR: 2

IQR/Median kPa ratio:

Data quality: IQR/Median kPa ratio of 0.3 or greater indicates
reduced accuracy

Diagnostic category: < or = 9 kPa: in the absence of other known
clinical signs, rules out cACLD

The use of hepatic elastography is applicable to patients with viral
hepatitis and non-alcoholic fatty liver disease. At this time, there
is insufficient data for the referenced cut-off values and use in
other causes of liver disease, including alcoholic liver disease.
Patients, however, may be assessed by elastography and serve as
their own reference standard/baseline.

In patients with non-alcoholic liver disease, the values suggesting
compensated advanced chronic liver disease (cACLD) may be lower, and
patients may need additional testing with elasticity results of [DATE]
kPa.

Please note that abnormal hepatic elasticity and shear wave
velocities may also be identified in clinical settings other than
with hepatic fibrosis, such as: acute hepatitis, elevated right
heart and central venous pressures including use of beta blockers,
Sonmez disease ([place]), infiltrative processes such as
mastocytosis/amyloidosis/infiltrative tumor/lymphoma, extrahepatic
cholestasis, with hyperemia in the post-prandial state, and with
liver transplantation. Correlation with patient history, laboratory
data, and clinical condition recommended.

Diagnostic Categories:

< or =5 kPa: high probability of being normal

< or =9 kPa: in the absence of other known clinical signs, rules [DATE] kPa and ?13 kPa: suggestive of cACLD, but needs further testing

>13 kPa: highly suggestive of cACLD

> or =17 kPa: highly suggestive of cACLD with an increased
probability of clinically significant portal hypertension
IMPRESSION: ULTRASOUND LIVER:

Diffusely increased echogenicity of the hepatic parenchyma,
nonspecific but commonly reflecting hepatic steatosis or chronic
hepatic disease.

ULTRASOUND HEPATIC ELASTOGRAPHY:

Median kPa:

Diagnostic category: < or = 9 kPa: in the absence of other known
clinical signs, rules out cACLD

## 2021-10-26 ENCOUNTER — Telehealth: Payer: Self-pay

## 2021-10-26 ENCOUNTER — Ambulatory Visit: Payer: BC Managed Care – PPO | Admitting: Infectious Diseases

## 2021-10-26 NOTE — Telephone Encounter (Signed)
Patient's daughter left voicemail requesting to reschedule appointment for Thursday. Attempted to call patient's daughter back but was not able to reach her. Voicemail is full. Juanita Laster, RMA

## 2021-10-28 ENCOUNTER — Ambulatory Visit: Payer: BC Managed Care – PPO | Attending: Infectious Diseases

## 2021-10-28 DIAGNOSIS — Z23 Encounter for immunization: Secondary | ICD-10-CM | POA: Diagnosis not present

## 2022-03-29 ENCOUNTER — Other Ambulatory Visit (HOSPITAL_COMMUNITY): Payer: Self-pay

## 2022-07-26 DIAGNOSIS — Z9289 Personal history of other medical treatment: Secondary | ICD-10-CM

## 2022-07-26 HISTORY — DX: Personal history of other medical treatment: Z92.89

## 2022-09-28 ENCOUNTER — Telehealth: Payer: Self-pay

## 2022-09-28 NOTE — Telephone Encounter (Signed)
Attempted to call patient to obtain cardiac history for upcoming cardiology appt.  No answer and unable to leave VM.  Referral notes are available in [facility] file.

## 2022-10-06 ENCOUNTER — Ambulatory Visit: Payer: BC Managed Care – PPO | Admitting: Cardiology

## 2022-12-14 ENCOUNTER — Telehealth: Payer: Self-pay

## 2022-12-14 ENCOUNTER — Ambulatory Visit: Payer: Managed Care, Other (non HMO) | Attending: Cardiology | Admitting: Internal Medicine

## 2022-12-14 ENCOUNTER — Encounter: Payer: Self-pay | Admitting: Internal Medicine

## 2022-12-14 VITALS — BP 158/100 | HR 60 | Ht 69.0 in | Wt 125.4 lb

## 2022-12-14 DIAGNOSIS — I071 Rheumatic tricuspid insufficiency: Secondary | ICD-10-CM | POA: Insufficient documentation

## 2022-12-14 DIAGNOSIS — I6523 Occlusion and stenosis of bilateral carotid arteries: Secondary | ICD-10-CM | POA: Diagnosis not present

## 2022-12-14 DIAGNOSIS — I10 Essential (primary) hypertension: Secondary | ICD-10-CM | POA: Insufficient documentation

## 2022-12-14 DIAGNOSIS — R0609 Other forms of dyspnea: Secondary | ICD-10-CM | POA: Insufficient documentation

## 2022-12-14 MED ORDER — ASPIRIN 81 MG PO TBEC: 81.0000 mg | DELAYED_RELEASE_TABLET | Freq: Every day | ORAL | Status: DC

## 2022-12-14 NOTE — Telephone Encounter (Signed)
Recent ECHO and lab work requested from pcp office for review.

## 2022-12-14 NOTE — Patient Instructions (Signed)
Medication Instructions:  Your physician recommends the following medication changes.  RESTART: Aspirin 81 mg by mouth daily   *If you need a refill on your cardiac medications before your next appointment, please call your pharmacy*   Lab Work: No labs ordered today    Testing/Procedures: Your provider has ordered a Lexiscan/ Exercise Myoview Stress test. This will take place at [facility]. Please report to the [facility] medical mall entrance. The volunteers at the first desk will direct you where to go.  [facility] MYOVIEW  Your provider has ordered a Stress Test with nuclear imaging. The purpose of this test is to evaluate the blood supply to your heart muscle. This procedure is referred to as a "Non-Invasive Stress Test." This is because other than having an IV started in your vein, nothing is inserted or "invades" your body. Cardiac stress tests are done to find areas of poor blood flow to the heart by determining the extent of coronary artery disease (CAD). Some patients exercise on a treadmill, which naturally increases the blood flow to your heart, while others who are unable to walk on a treadmill due to physical limitations will have a pharmacologic/chemical stress agent called Lexiscan . This medicine will mimic walking on a treadmill by temporarily increasing your coronary blood flow.   Please note: these test may take anywhere between 2-4 hours to complete  How to prepare for your Myoview test:  Nothing to eat for 6 hours prior to the test No caffeine for 24 hours prior to test No smoking 24 hours prior to test. Your medication may be taken with water.  If your doctor stopped a medication because of this test, do not take that medication. Ladies, please do not wear dresses.  Skirts or pants are appropriate. Please wear a short sleeve shirt. No perfume, cologne or lotion. Wear comfortable walking shoes. No heels!   PLEASE NOTIFY THE OFFICE AT LEAST 24 HOURS IN ADVANCE IF YOU ARE UNABLE TO  KEEP YOUR APPOINTMENT.  [phone] AND  PLEASE NOTIFY NUCLEAR MEDICINE AT [facility] AT LEAST 24 HOURS IN ADVANCE IF YOU ARE UNABLE TO KEEP YOUR APPOINTMENT. [phone]    Follow-Up: At [facility], you and your health needs are our priority.  As part of our continuing mission to provide you with exceptional heart care, we have created designated Provider Care Teams.  These Care Teams include your primary Cardiologist (physician) and Advanced Practice Providers (APPs -  Physician Assistants and Nurse Practitioners) who all work together to provide you with the care you need, when you need it.  We recommend signing up for the patient portal called "MyChart".  Sign up information is provided on this After Visit Summary.  MyChart is used to connect with patients for Virtual Visits (Telemedicine).  Patients are able to view lab/test results, encounter notes, upcoming appointments, etc.  Non-urgent messages can be sent to your provider as well.   To learn more about what you can do with MyChart, go to [email or web address].    Your next appointment:   6 month(s)  Provider:   You may see Yvonne Kendall, MD or one of the following Advanced Practice Providers on your designated Care Team:   Nicolasa Ducking, NP Eula Listen, PA-C Cadence Fransico Michael, PA-C Charlsie Quest, NP
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
## 2023-01-23 ENCOUNTER — Other Ambulatory Visit: Payer: Self-pay | Admitting: Orthopedic Surgery

## 2023-01-31 ENCOUNTER — Inpatient Hospital Stay: Admission: RE | Admit: 2023-01-31 | Payer: Managed Care, Other (non HMO) | Source: Ambulatory Visit

## 2023-02-01 ENCOUNTER — Encounter
Admission: RE | Admit: 2023-02-01 | Discharge: 2023-02-01 | Disposition: A | Discharge status: Home / Self Care | Payer: Managed Care, Other (non HMO) | Source: Ambulatory Visit | Attending: Internal Medicine | Admitting: Internal Medicine

## 2023-02-01 DIAGNOSIS — R0609 Other forms of dyspnea: Secondary | ICD-10-CM | POA: Insufficient documentation

## 2023-02-01 MED ORDER — TECHNETIUM TC 99M TETROFOSMIN IV KIT
28.4100 | PACK | Freq: Once | INTRAVENOUS | Status: AC | PRN
  Administered 2023-02-01: 28.41 via INTRAVENOUS

## 2023-02-01 MED ORDER — TECHNETIUM TC 99M TETROFOSMIN IV KIT
10.0000 | PACK | Freq: Once | INTRAVENOUS | Status: AC | PRN
  Administered 2023-02-01: 11.02 via INTRAVENOUS

## 2023-02-01 MED ORDER — REGADENOSON 0.4 MG/5ML IV SOLN
0.4000 mg | Freq: Once | INTRAVENOUS | Status: AC
  Administered 2023-02-01: 0.4 mg via INTRAVENOUS
  Filled 2023-02-01: qty 5

## 2023-02-02 ENCOUNTER — Encounter
Admission: RE | Admit: 2023-02-02 | Discharge: 2023-02-02 | Disposition: A | Discharge status: Home / Self Care | Payer: Managed Care, Other (non HMO) | Source: Ambulatory Visit | Attending: Orthopedic Surgery | Admitting: Orthopedic Surgery

## 2023-02-02 VITALS — BP 110/65 | HR 76 | Resp 14 | Ht 69.0 in | Wt 128.1 lb

## 2023-02-02 DIAGNOSIS — Z01812 Encounter for preprocedural laboratory examination: Secondary | ICD-10-CM | POA: Diagnosis present

## 2023-02-02 DIAGNOSIS — Z01818 Encounter for other preprocedural examination: Secondary | ICD-10-CM

## 2023-02-02 HISTORY — DX: Alcohol abuse, uncomplicated: F10.10

## 2023-02-02 HISTORY — DX: Personal history of nicotine dependence: Z87.891

## 2023-02-02 HISTORY — DX: Rheumatoid arthritis, unspecified: M06.9

## 2023-02-02 HISTORY — DX: Hypo-osmolality and hyponatremia: E87.1

## 2023-02-02 HISTORY — DX: Gastro-esophageal reflux disease without esophagitis: K21.9

## 2023-02-02 HISTORY — DX: Unspecified osteoarthritis, unspecified site: M19.90

## 2023-02-02 HISTORY — DX: Cannabis abuse, uncomplicated: F12.10

## 2023-02-02 HISTORY — DX: Essential (primary) hypertension: I10

## 2023-02-02 HISTORY — DX: Hyperlipidemia, unspecified: E78.5

## 2023-02-02 HISTORY — DX: Chronic obstructive pulmonary disease, unspecified: J44.9

## 2023-02-02 HISTORY — DX: Cocaine abuse, in remission: F14.11

## 2023-02-02 LAB — CBC WITH DIFFERENTIAL/PLATELET
Abs Immature Granulocytes: 0.1 10*3/uL — ABNORMAL HIGH (ref 0.00–0.07)
Basophils Absolute: 0.1 10*3/uL (ref 0.0–0.1)
Basophils Relative: 0 %
Eosinophils Absolute: 0.1 10*3/uL (ref 0.0–0.5)
Eosinophils Relative: 1 %
HCT: 31.7 % — ABNORMAL LOW (ref 39.0–52.0)
Hemoglobin: 10.9 g/dL — ABNORMAL LOW (ref 13.0–17.0)
Immature Granulocytes: 1 %
Lymphocytes Relative: 10 %
Lymphs Abs: 1.2 10*3/uL (ref 0.7–4.0)
MCH: 32.3 pg (ref 26.0–34.0)
MCHC: 34.4 g/dL (ref 30.0–36.0)
MCV: 94.1 fL (ref 80.0–100.0)
Monocytes Absolute: 1.2 10*3/uL — ABNORMAL HIGH (ref 0.1–1.0)
Monocytes Relative: 10 %
Neutro Abs: 8.9 10*3/uL — ABNORMAL HIGH (ref 1.7–7.7)
Neutrophils Relative %: 78 %
Platelets: 249 10*3/uL (ref 150–400)
RBC: 3.37 MIL/uL — ABNORMAL LOW (ref 4.22–5.81)
RDW: 12.3 % (ref 11.5–15.5)
WBC: 11.5 10*3/uL — ABNORMAL HIGH (ref 4.0–10.5)
nRBC: 0 % (ref 0.0–0.2)

## 2023-02-02 LAB — COMPREHENSIVE METABOLIC PANEL WITH GFR
ALT: 29 U/L (ref 0–44)
AST: 49 U/L — ABNORMAL HIGH (ref 15–41)
Albumin: 4 g/dL (ref 3.5–5.0)
Alkaline Phosphatase: 73 U/L (ref 38–126)
Anion gap: 10 (ref 5–15)
BUN: 8 mg/dL (ref 6–20)
CO2: 27 mmol/L (ref 22–32)
Calcium: 8.4 mg/dL — ABNORMAL LOW (ref 8.9–10.3)
Chloride: 86 mmol/L — ABNORMAL LOW (ref 98–111)
Creatinine, Ser: 0.8 mg/dL (ref 0.61–1.24)
GFR, Estimated: >60 mL/min
Glucose, Bld: 126 mg/dL — ABNORMAL HIGH (ref 70–99)
Potassium: 3.3 mmol/L — ABNORMAL LOW (ref 3.5–5.1)
Sodium: 123 mmol/L — ABNORMAL LOW (ref 135–145)
Total Bilirubin: 0.7 mg/dL
Total Protein: 6.5 g/dL (ref 6.5–8.1)

## 2023-02-02 LAB — URINALYSIS, ROUTINE W REFLEX MICROSCOPIC
Bilirubin Urine: NEGATIVE
Glucose, UA: NEGATIVE mg/dL
Hgb urine dipstick: NEGATIVE
Ketones, ur: NEGATIVE mg/dL
Leukocytes,Ua: NEGATIVE
Nitrite: NEGATIVE
Protein, ur: NEGATIVE mg/dL
Specific Gravity, Urine: 1.016 (ref 1.005–1.030)
pH: 5 (ref 5.0–8.0)

## 2023-02-02 LAB — SURGICAL PCR SCREEN
MRSA, PCR: NEGATIVE
Staphylococcus aureus: POSITIVE — AB

## 2023-02-02 NOTE — Patient Instructions (Addendum)
Your procedure is scheduled on:02-06-23 Monday Report to the Registration Desk on the 1st floor of the [facility].Then proceed to the 2nd floor Surgery Desk To find out your arrival time, please call [phone] between 1PM - 3PM on:02-03-23 Friday If your arrival time is 6:00 am, do not arrive before that time as the [facility] entrance doors do not open until 6:00 am.  REMEMBER: Instructions that are not followed completely may result in serious medical risk, up to and including death; or upon the discretion of your surgeon and anesthesiologist your surgery may need to be rescheduled.  Do not eat food after midnight the night before surgery.  No gum chewing or hard candies.  You may however, drink CLEAR liquids up to 2 hours before you are scheduled to arrive for your surgery. Do not drink anything within 2 hours of your scheduled arrival time.  Clear liquids include: - water  - apple juice without pulp - gatorade (not RED colors) - black coffee or tea (Do NOT add milk or creamers to the coffee or tea) Do NOT drink anything that is not on this list  In addition, your doctor has ordered for you to drink the provided:  Ensure Pre-Surgery Clear Carbohydrate Drink Drinking this carbohydrate drink up to two hours before surgery helps to reduce insulin resistance and improve patient outcomes. Please complete drinking 2 hours before scheduled arrival time.  One week prior to surgery: Stop Anti-inflammatories (NSAIDS) such as Advil, Aleve, Ibuprofen, Motrin, Naproxen, Naprosyn and Aspirin based products such as Excedrin, Goody's Powder, BC Powder. Stop ANY OVER THE COUNTER supplements until after surgery (Multivitamin)  You may however, continue to take Tylenol if needed for pain up until the day of surgery.  Continue taking all of your other prescription medications up until the day of surgery.  ON THE DAY OF SURGERY ONLY TAKE THESE MEDICATIONS WITH SIPS OF WATER: -metoprolol  succinate (TOPROL-XL)  -sodium chloride   Use your Trelegy Ellipta the morning of surgery  No Alcohol for 24 hours before or after surgery.  No Smoking including e-cigarettes for 24 hours before surgery.  No chewable tobacco products for at least 6 hours before surgery.  No nicotine patches on the day of surgery.  Do not use any "recreational" drugs for at least a week (preferably 2 weeks) before your surgery.  Please be advised that the combination of cocaine and anesthesia may have negative outcomes, up to and including death. If you test positive for cocaine, your surgery will be cancelled.  On the morning of surgery brush your teeth with toothpaste and water, you may rinse your mouth with mouthwash if you wish. Do not swallow any toothpaste or mouthwash.  Use CHG Soap as directed on instruction sheet.  Do not wear jewelry, make-up, hairpins, clips or nail polish.  For welded (permanent) jewelry: bracelets, anklets, waist bands, etc.  Please have this removed prior to surgery.  If it is not removed, there is a chance that hospital personnel will need to cut it off on the day of surgery.  Do not wear lotions, powders, or perfumes.   Do not shave body hair from the neck down 48 hours before surgery.  Contact lenses, hearing aids and dentures may not be worn into surgery.  Do not bring valuables to the hospital. [facility] is not responsible for any missing/lost belongings or valuables.   Notify your doctor if there is any change in your medical condition (cold, fever, infection).  Wear  comfortable clothing (specific to your surgery type) to the hospital.  After surgery, you can help prevent lung complications by doing breathing exercises.  Take deep breaths and cough every 1-2 hours. Your doctor may order a device called an Incentive Spirometer to help you take deep breaths. When coughing or sneezing, hold a pillow firmly against your incision with both hands. This is called  "splinting." Doing this helps protect your incision. It also decreases belly discomfort.  If you are being admitted to the hospital overnight, leave your suitcase in the car. After surgery it may be brought to your room.  In case of increased patient census, it may be necessary for you, the patient, to continue your postoperative care in the Same Day Surgery department.  If you are being discharged the day of surgery, you will not be allowed to drive home. You will need a responsible individual to drive you home and stay with you for 24 hours after surgery.   If you are taking public transportation, you will need to have a responsible individual with you.  Please call the Pre-admissions Testing Dept. at [phone] if you have any questions about these instructions.  Surgery Visitation Policy:  Patients having surgery or a procedure may have two visitors.  Children under the age of 72 must have an adult with them who is not the patient.  Inpatient Visitation:    Visiting hours are 7 a.m. to 8 p.m. Up to four visitors are allowed at one time in a patient room. The visitors may rotate out with other people during the day.  One visitor age 18 or older may stay with the patient overnight and must be in the room by 8 p.m.    Pre-operative 5 CHG Bath Instructions   You can play a key role in reducing the risk of infection after surgery. Your skin needs to be as free of germs as possible. You can reduce the number of germs on your skin by washing with CHG (chlorhexidine gluconate) soap before surgery. CHG is an antiseptic soap that kills germs and continues to kill germs even after washing.   DO NOT use if you have an allergy to chlorhexidine/CHG or antibacterial soaps. If your skin becomes reddened or irritated, stop using the CHG and notify one of our RNs at [phone].   Please shower with the CHG soap starting 4 days before surgery using the following schedule:     Please keep  in mind the following:  DO NOT shave, including legs and underarms, starting the day of your first shower.   You may shave your face at any point before/day of surgery.  Place clean sheets on your bed the day you start using CHG soap. Use a clean washcloth (not used since being washed) for each shower. DO NOT sleep with pets once you start using the CHG.   CHG Shower Instructions:  If you choose to wash your hair and private area, wash first with your normal shampoo/soap.  After you use shampoo/soap, rinse your hair and body thoroughly to remove shampoo/soap residue.  Turn the water OFF and apply about 3 tablespoons (45 ml) of CHG soap to a CLEAN washcloth.  Apply CHG soap ONLY FROM YOUR NECK DOWN TO YOUR TOES (washing for 3-5 minutes)  DO NOT use CHG soap on face, private areas, open wounds, or sores.  Pay special attention to the area where your surgery is being performed.  If you are having back surgery, having someone  wash your back for you may be helpful. Wait 2 minutes after CHG soap is applied, then you may rinse off the CHG soap.  Pat dry with a clean towel  Put on clean clothes/pajamas   If you choose to wear lotion, please use ONLY the CHG-compatible lotions on the back of this paper.     Additional instructions for the day of surgery: DO NOT APPLY any lotions, deodorants, cologne, or perfumes.   Put on clean/comfortable clothes.  Brush your teeth.  Ask your nurse before applying any prescription medications to the skin.      CHG Compatible Lotions   Aveeno Moisturizing lotion  Cetaphil Moisturizing Cream  Cetaphil Moisturizing Lotion  Clairol Herbal Essence Moisturizing Lotion, Dry Skin  Clairol Herbal Essence Moisturizing Lotion, Extra Dry Skin  Clairol Herbal Essence Moisturizing Lotion, Normal Skin  Curel Age Defying Therapeutic Moisturizing Lotion with Alpha Hydroxy  Curel Extreme Care Body Lotion  Curel Soothing Hands Moisturizing Hand Lotion  Curel  Therapeutic Moisturizing Cream, Fragrance-Free  Curel Therapeutic Moisturizing Lotion, Fragrance-Free  Curel Therapeutic Moisturizing Lotion, Original Formula  Eucerin Daily Replenishing Lotion  Eucerin Dry Skin Therapy Plus Alpha Hydroxy Crme  Eucerin Dry Skin Therapy Plus Alpha Hydroxy Lotion  Eucerin Original Crme  Eucerin Original Lotion  Eucerin Plus Crme Eucerin Plus Lotion  Eucerin TriLipid Replenishing Lotion  Keri Anti-Bacterial Hand Lotion  Keri Deep Conditioning Original Lotion Dry Skin Formula Softly Scented  Keri Deep Conditioning Original Lotion, Fragrance Free Sensitive Skin Formula  Keri Lotion Fast Absorbing Fragrance Free Sensitive Skin Formula  Keri Lotion Fast Absorbing Softly Scented Dry Skin Formula  Keri Original Lotion  Keri Skin Renewal Lotion Keri Silky Smooth Lotion  Keri Silky Smooth Sensitive Skin Lotion  Nivea Body Creamy Conditioning Oil  Nivea Body Extra Enriched Lotion  Nivea Body Original Lotion  Nivea Body Sheer Moisturizing Lotion Nivea Crme  Nivea Skin Firming Lotion  NutraDerm 30 Skin Lotion  NutraDerm Skin Lotion  NutraDerm Therapeutic Skin Cream  NutraDerm Therapeutic Skin Lotion  ProShield Protective Hand Cream  Provon moisturizing lotion  How to Use an Incentive Spirometer An incentive spirometer is a tool that measures how well you are filling your lungs with each breath. Learning to take long, deep breaths using this tool can help you keep your lungs clear and active. This may help to reverse or lessen your chance of developing breathing (pulmonary) problems, especially infection. You may be asked to use a spirometer: After a surgery. If you have a lung problem or a history of smoking. After a long period of time when you have been unable to move or be active. If the spirometer includes an indicator to show the highest number that you have reached, your health care provider or respiratory therapist will help you set a goal. Keep a log  of your progress as told by your health care provider. What are the risks? Breathing too quickly may cause dizziness or cause you to pass out. Take your time so you do not get dizzy or light-headed. If you are in pain, you may need to take pain medicine before doing incentive spirometry. It is harder to take a deep breath if you are having pain. How to use your incentive spirometer  Sit up on the edge of your bed or on a chair. Hold the incentive spirometer so that it is in an upright position. Before you use the spirometer, breathe out normally. Place the mouthpiece in your mouth. Make sure your lips are  closed tightly around it. Breathe in slowly and as deeply as you can through your mouth, causing the piston or the ball to rise toward the top of the chamber. Hold your breath for 3-5 seconds, or for as long as possible. If the spirometer includes a coach indicator, use this to guide you in breathing. Slow down your breathing if the indicator goes above the marked areas. Remove the mouthpiece from your mouth and breathe out normally. The piston or ball will return to the bottom of the chamber. Rest for a few seconds, then repeat the steps 10 or more times. Take your time and take a few normal breaths between deep breaths so that you do not get dizzy or light-headed. Do this every 1-2 hours when you are awake. If the spirometer includes a goal marker to show the highest number you have reached (best effort), use this as a goal to work toward during each repetition. After each set of 10 deep breaths, cough a few times. This will help to make sure that your lungs are clear. If you have an incision on your chest or abdomen from surgery, place a pillow or a rolled-up towel firmly against the incision when you cough. This can help to reduce pain while taking deep breaths and coughing. General tips When you are able to get out of bed: Walk around often. Continue to take deep breaths and cough in  order to clear your lungs. Keep using the incentive spirometer until your health care provider says it is okay to stop using it. If you have been in the hospital, you may be told to keep using the spirometer at home. Contact a health care provider if: You are having difficulty using the spirometer. You have trouble using the spirometer as often as instructed. Your pain medicine is not giving enough relief for you to use the spirometer as told. You have a fever. Get help right away if: You develop shortness of breath. You develop a cough with bloody mucus from the lungs. You have fluid or blood coming from an incision site after you cough. Summary An incentive spirometer is a tool that can help you learn to take long, deep breaths to keep your lungs clear and active. You may be asked to use a spirometer after a surgery, if you have a lung problem or a history of smoking, or if you have been inactive for a long period of time. Use your incentive spirometer as instructed every 1-2 hours while you are awake. If you have an incision on your chest or abdomen, place a pillow or a rolled-up towel firmly against your incision when you cough. This will help to reduce pain. Get help right away if you have shortness of breath, you cough up bloody mucus, or blood comes from your incision when you cough. This information is not intended to replace advice given to you by your health care provider. Make sure you discuss any questions you have with your health care provider. Document Revised: 05/27/2019 Document Reviewed: 05/27/2019 Elsevier Patient Education  2024 [employer].

## 2023-02-03 ENCOUNTER — Encounter: Payer: Self-pay | Admitting: Orthopedic Surgery

## 2023-02-03 LAB — NM MYOCAR MULTI W/SPECT W/WALL MOTION / EF
LV dias vol: 29 mL (ref 62–150)
LV sys vol: 10 mL
Nuc Stress EF: 66 %
Peak HR: 121 {beats}/min
Percent HR: 75 %
Rest HR: 78 {beats}/min
Rest Nuclear Isotope Dose: 11 mCi
SDS: 0
SRS: 0
SSS: 0
ST Depression (mm): 0 mm
Stress Nuclear Isotope Dose: 28.4 mCi
TID: 1.23

## 2023-02-06 ENCOUNTER — Ambulatory Visit: Payer: Managed Care, Other (non HMO)

## 2023-02-06 ENCOUNTER — Ambulatory Visit: Payer: Managed Care, Other (non HMO) | Admitting: Urgent Care

## 2023-02-06 ENCOUNTER — Encounter: Payer: Self-pay | Admitting: Orthopedic Surgery

## 2023-02-06 ENCOUNTER — Encounter
Admission: RE | Disposition: A | Discharge status: Home Health | Payer: Self-pay | Source: Home / Self Care | Attending: Orthopedic Surgery

## 2023-02-06 ENCOUNTER — Observation Stay
Admission: RE | Admit: 2023-02-06 | Discharge: 2023-02-08 | Disposition: A | Discharge status: Home Health | Payer: Managed Care, Other (non HMO) | Attending: Orthopedic Surgery | Admitting: Orthopedic Surgery

## 2023-02-06 ENCOUNTER — Ambulatory Visit: Payer: Self-pay | Admitting: Urgent Care

## 2023-02-06 ENCOUNTER — Other Ambulatory Visit: Payer: Self-pay

## 2023-02-06 DIAGNOSIS — M1712 Unilateral primary osteoarthritis, left knee: Secondary | ICD-10-CM | POA: Diagnosis present

## 2023-02-06 DIAGNOSIS — Z96652 Presence of left artificial knee joint: Principal | ICD-10-CM

## 2023-02-06 DIAGNOSIS — J449 Chronic obstructive pulmonary disease, unspecified: Secondary | ICD-10-CM | POA: Diagnosis not present

## 2023-02-06 DIAGNOSIS — I10 Essential (primary) hypertension: Secondary | ICD-10-CM | POA: Insufficient documentation

## 2023-02-06 DIAGNOSIS — Z7982 Long term (current) use of aspirin: Secondary | ICD-10-CM | POA: Insufficient documentation

## 2023-02-06 DIAGNOSIS — M21062 Valgus deformity, not elsewhere classified, left knee: Secondary | ICD-10-CM | POA: Insufficient documentation

## 2023-02-06 DIAGNOSIS — Z79899 Other long term (current) drug therapy: Secondary | ICD-10-CM | POA: Insufficient documentation

## 2023-02-06 HISTORY — DX: Other psychoactive substance abuse, uncomplicated: F19.10

## 2023-02-06 HISTORY — DX: Personal history of other infectious and parasitic diseases: Z86.19

## 2023-02-06 HISTORY — DX: Disorder of arteries and arterioles, unspecified: I77.9

## 2023-02-06 HISTORY — PX: TOTAL KNEE ARTHROPLASTY: SHX125

## 2023-02-06 LAB — COMPREHENSIVE METABOLIC PANEL
ALT: 19 U/L (ref 0–44)
AST: 31 U/L (ref 15–41)
Albumin: 3.9 g/dL (ref 3.5–5.0)
Alkaline Phosphatase: 69 U/L (ref 38–126)
Anion gap: 9 (ref 5–15)
BUN: 9 mg/dL (ref 6–20)
CO2: 28 mmol/L (ref 22–32)
Calcium: 8.8 mg/dL — ABNORMAL LOW (ref 8.9–10.3)
Chloride: 96 mmol/L — ABNORMAL LOW (ref 98–111)
Creatinine, Ser: 0.84 mg/dL (ref 0.61–1.24)
GFR, Estimated: >60 mL/min (ref >60)
Glucose, Bld: 91 mg/dL (ref 70–99)
Potassium: 4 mmol/L (ref 3.5–5.1)
Sodium: 133 mmol/L — ABNORMAL LOW (ref 135–145)
Total Bilirubin: 0.5 mg/dL (ref <1.2)
Total Protein: 6.7 g/dL (ref 6.5–8.1)

## 2023-02-06 SURGERY — ARTHROPLASTY, KNEE, TOTAL
Anesthesia: General | Site: Knee | Laterality: Left

## 2023-02-06 MED ORDER — METOCLOPRAMIDE HCL 5 MG/ML IJ SOLN: 5.0000 mg | Freq: Three times a day (TID) | INTRAMUSCULAR | Status: DC | PRN

## 2023-02-06 MED ORDER — DEXMEDETOMIDINE HCL IN NACL 80 MCG/20ML IV SOLN
INTRAVENOUS | Status: AC
  Filled 2023-02-06: qty 20

## 2023-02-06 MED ORDER — LACTATED RINGERS IV SOLN: INTRAVENOUS | Status: DC

## 2023-02-06 MED ORDER — THIAMINE MONONITRATE 100 MG PO TABS
100.0000 mg | ORAL_TABLET | Freq: Every day | ORAL | Status: DC
  Administered 2023-02-06 – 2023-02-08 (×3): 100 mg via ORAL
  Filled 2023-02-06 (×3): qty 1

## 2023-02-06 MED ORDER — HYDRALAZINE HCL 20 MG/ML IJ SOLN
INTRAMUSCULAR | Status: AC
  Filled 2023-02-06: qty 1

## 2023-02-06 MED ORDER — ROSUVASTATIN CALCIUM 10 MG PO TABS
40.0000 mg | ORAL_TABLET | Freq: Every evening | ORAL | Status: DC
  Administered 2023-02-06 – 2023-02-07 (×2): 40 mg via ORAL
  Filled 2023-02-06 (×2): qty 4

## 2023-02-06 MED ORDER — METOPROLOL TARTRATE 5 MG/5ML IV SOLN
INTRAVENOUS | Status: DC | PRN
  Administered 2023-02-06 (×2): 2.5 mg via INTRAVENOUS

## 2023-02-06 MED ORDER — LABETALOL HCL 5 MG/ML IV SOLN
5.0000 mg | Freq: Once | INTRAVENOUS | Status: AC
Start: 2023-02-06 — End: 2023-02-06
  Administered 2023-02-06: 5 mg via INTRAVENOUS
  Filled 2023-02-06: qty 4

## 2023-02-06 MED ORDER — OXYCODONE HCL 5 MG PO TABS
ORAL_TABLET | ORAL | Status: AC
  Filled 2023-02-06: qty 2

## 2023-02-06 MED ORDER — FOLIC ACID 1 MG PO TABS
1.0000 mg | ORAL_TABLET | Freq: Every day | ORAL | Status: DC
  Administered 2023-02-06 – 2023-02-08 (×3): 1 mg via ORAL
  Filled 2023-02-06 (×3): qty 1

## 2023-02-06 MED ORDER — CEFAZOLIN SODIUM-DEXTROSE 2-4 GM/100ML-% IV SOLN
2.0000 g | Freq: Four times a day (QID) | INTRAVENOUS | Status: AC
  Administered 2023-02-06 – 2023-02-07 (×2): 2 g via INTRAVENOUS
  Filled 2023-02-06 (×4): qty 100

## 2023-02-06 MED ORDER — FENTANYL CITRATE (PF) 100 MCG/2ML IJ SOLN
25.0000 ug | INTRAMUSCULAR | Status: DC | PRN
Start: 2023-02-06 — End: 2023-02-06
  Administered 2023-02-06 (×2): 50 ug via INTRAVENOUS

## 2023-02-06 MED ORDER — EPHEDRINE SULFATE-NACL 50-0.9 MG/10ML-% IV SOSY
PREFILLED_SYRINGE | INTRAVENOUS | Status: DC | PRN
  Administered 2023-02-06: 5 mg via INTRAVENOUS

## 2023-02-06 MED ORDER — MIDAZOLAM HCL 5 MG/5ML IJ SOLN
INTRAMUSCULAR | Status: DC | PRN
  Administered 2023-02-06: 2 mg via INTRAVENOUS

## 2023-02-06 MED ORDER — UMECLIDINIUM BROMIDE 62.5 MCG/ACT IN AEPB
1.0000 | INHALATION_SPRAY | Freq: Every day | RESPIRATORY_TRACT | Status: DC
  Administered 2023-02-06 – 2023-02-08 (×3): 1 via RESPIRATORY_TRACT
  Filled 2023-02-06: qty 7

## 2023-02-06 MED ORDER — MIDAZOLAM HCL 2 MG/2ML IJ SOLN
INTRAMUSCULAR | Status: AC
  Filled 2023-02-06: qty 2

## 2023-02-06 MED ORDER — LIDOCAINE HCL (CARDIAC) PF 100 MG/5ML IV SOSY
PREFILLED_SYRINGE | INTRAVENOUS | Status: DC | PRN
  Administered 2023-02-06: 20 mg via INTRAVENOUS
  Administered 2023-02-06: 80 mg via INTRAVENOUS

## 2023-02-06 MED ORDER — PHENOL 1.4 % MT LIQD: 1.0000 | OROMUCOSAL | Status: DC | PRN

## 2023-02-06 MED ORDER — SODIUM CHLORIDE 0.9 % IR SOLN
Status: DC | PRN
  Administered 2023-02-06: 3000 mL

## 2023-02-06 MED ORDER — LORAZEPAM 2 MG/ML IJ SOLN: 1.0000 mg | INTRAMUSCULAR | Status: DC | PRN

## 2023-02-06 MED ORDER — HYDROMORPHONE HCL 1 MG/ML IJ SOLN
INTRAMUSCULAR | Status: DC | PRN
  Administered 2023-02-06 (×2): .5 mg via INTRAVENOUS

## 2023-02-06 MED ORDER — DEXAMETHASONE SODIUM PHOSPHATE 10 MG/ML IJ SOLN
INTRAMUSCULAR | Status: AC
  Filled 2023-02-06: qty 1

## 2023-02-06 MED ORDER — TRANEXAMIC ACID-NACL 1000-0.7 MG/100ML-% IV SOLN
INTRAVENOUS | Status: AC
  Filled 2023-02-06: qty 100

## 2023-02-06 MED ORDER — DEXAMETHASONE SODIUM PHOSPHATE 10 MG/ML IJ SOLN
8.0000 mg | Freq: Once | INTRAMUSCULAR | Status: AC
  Administered 2023-02-06: 10 mg via INTRAVENOUS

## 2023-02-06 MED ORDER — MIDAZOLAM HCL 2 MG/2ML IJ SOLN
1.0000 mg | Freq: Once | INTRAMUSCULAR | Status: AC
  Administered 2023-02-06: 1 mg via INTRAVENOUS

## 2023-02-06 MED ORDER — OXYCODONE HCL 5 MG/5ML PO SOLN: 5.0000 mg | Freq: Once | ORAL | Status: DC | PRN

## 2023-02-06 MED ORDER — FLUTICASONE FUROATE-VILANTEROL 100-25 MCG/ACT IN AEPB
1.0000 | INHALATION_SPRAY | Freq: Every day | RESPIRATORY_TRACT | Status: DC
  Administered 2023-02-06 – 2023-02-08 (×3): 1 via RESPIRATORY_TRACT
  Filled 2023-02-06: qty 28

## 2023-02-06 MED ORDER — LABETALOL HCL 5 MG/ML IV SOLN
5.0000 mg | INTRAVENOUS | Status: DC | PRN
  Administered 2023-02-06: 5 mg via INTRAVENOUS

## 2023-02-06 MED ORDER — ORAL CARE MOUTH RINSE
15.0000 mL | Freq: Once | OROMUCOSAL | Status: AC
Start: 2023-02-06 — End: 2023-02-06

## 2023-02-06 MED ORDER — ACETAMINOPHEN 325 MG PO TABS: 325.0000 mg | ORAL_TABLET | Freq: Four times a day (QID) | ORAL | Status: DC | PRN

## 2023-02-06 MED ORDER — METHYLENE BLUE (ANTIDOTE) 1 % IV SOLN
INTRAVENOUS | Status: AC
  Filled 2023-02-06: qty 10

## 2023-02-06 MED ORDER — KETOROLAC TROMETHAMINE 15 MG/ML IJ SOLN
INTRAMUSCULAR | Status: AC
  Filled 2023-02-06: qty 1

## 2023-02-06 MED ORDER — ROCURONIUM BROMIDE 100 MG/10ML IV SOLN
INTRAVENOUS | Status: DC | PRN
  Administered 2023-02-06: 20 mg via INTRAVENOUS
  Administered 2023-02-06: 60 mg via INTRAVENOUS

## 2023-02-06 MED ORDER — ONDANSETRON HCL 4 MG/2ML IJ SOLN
4.0000 mg | Freq: Four times a day (QID) | INTRAMUSCULAR | Status: DC | PRN
  Administered 2023-02-08: 4 mg via INTRAVENOUS
  Filled 2023-02-06: qty 2

## 2023-02-06 MED ORDER — ACETAMINOPHEN 500 MG PO TABS
1000.0000 mg | ORAL_TABLET | Freq: Four times a day (QID) | ORAL | Status: AC
Start: 2023-02-06 — End: 2023-02-07
  Administered 2023-02-06 – 2023-02-07 (×3): 1000 mg via ORAL
  Filled 2023-02-06 (×4): qty 2

## 2023-02-06 MED ORDER — DEXMEDETOMIDINE HCL IN NACL 80 MCG/20ML IV SOLN
INTRAVENOUS | Status: DC | PRN
  Administered 2023-02-06: 8 ug via INTRAVENOUS
  Administered 2023-02-06: 12 ug via INTRAVENOUS

## 2023-02-06 MED ORDER — FENTANYL CITRATE (PF) 100 MCG/2ML IJ SOLN
INTRAMUSCULAR | Status: DC | PRN
  Administered 2023-02-06 (×2): 50 ug via INTRAVENOUS

## 2023-02-06 MED ORDER — ACETAMINOPHEN 10 MG/ML IV SOLN
INTRAVENOUS | Status: AC
  Filled 2023-02-06: qty 100

## 2023-02-06 MED ORDER — ADULT MULTIVITAMIN W/MINERALS CH
1.0000 | ORAL_TABLET | Freq: Every day | ORAL | Status: DC
  Administered 2023-02-06 – 2023-02-08 (×3): 1 via ORAL
  Filled 2023-02-06 (×3): qty 1

## 2023-02-06 MED ORDER — ONDANSETRON HCL 4 MG/2ML IJ SOLN
INTRAMUSCULAR | Status: DC | PRN
  Administered 2023-02-06 (×2): 4 mg via INTRAVENOUS

## 2023-02-06 MED ORDER — KETOROLAC TROMETHAMINE 15 MG/ML IJ SOLN
7.5000 mg | Freq: Four times a day (QID) | INTRAMUSCULAR | Status: AC
  Administered 2023-02-06 – 2023-02-07 (×4): 7.5 mg via INTRAVENOUS
  Filled 2023-02-06 (×3): qty 1

## 2023-02-06 MED ORDER — CEFAZOLIN SODIUM-DEXTROSE 2-4 GM/100ML-% IV SOLN
INTRAVENOUS | Status: AC
  Filled 2023-02-06: qty 100

## 2023-02-06 MED ORDER — BUPIVACAINE LIPOSOME 1.3 % IJ SUSP
INTRAMUSCULAR | Status: AC
  Filled 2023-02-06: qty 20

## 2023-02-06 MED ORDER — ACETAMINOPHEN 10 MG/ML IV SOLN: 1000.0000 mg | Freq: Once | INTRAVENOUS | Status: DC | PRN

## 2023-02-06 MED ORDER — ONDANSETRON HCL 4 MG/2ML IJ SOLN: 4.0000 mg | Freq: Once | INTRAMUSCULAR | Status: DC | PRN

## 2023-02-06 MED ORDER — ENOXAPARIN SODIUM 30 MG/0.3ML IJ SOSY
30.0000 mg | PREFILLED_SYRINGE | Freq: Two times a day (BID) | INTRAMUSCULAR | Status: DC
  Administered 2023-02-07 – 2023-02-08 (×3): 30 mg via SUBCUTANEOUS
  Filled 2023-02-06 (×3): qty 0.3

## 2023-02-06 MED ORDER — THIAMINE HCL 100 MG/ML IJ SOLN
100.0000 mg | Freq: Every day | INTRAMUSCULAR | Status: DC
  Filled 2023-02-06 (×2): qty 2

## 2023-02-06 MED ORDER — CHLORHEXIDINE GLUCONATE 0.12 % MT SOLN
15.0000 mL | Freq: Once | OROMUCOSAL | Status: AC
  Administered 2023-02-06: 15 mL via OROMUCOSAL

## 2023-02-06 MED ORDER — ESMOLOL HCL 100 MG/10ML IV SOLN
INTRAVENOUS | Status: DC | PRN
  Administered 2023-02-06: 30 mg via INTRAVENOUS
  Administered 2023-02-06: 20 mg via INTRAVENOUS

## 2023-02-06 MED ORDER — OXYCODONE HCL 5 MG PO TABS
10.0000 mg | ORAL_TABLET | ORAL | Status: DC | PRN
  Administered 2023-02-06 – 2023-02-08 (×9): 10 mg via ORAL
  Filled 2023-02-06 (×9): qty 2

## 2023-02-06 MED ORDER — FENTANYL CITRATE (PF) 100 MCG/2ML IJ SOLN
INTRAMUSCULAR | Status: AC
  Filled 2023-02-06: qty 2

## 2023-02-06 MED ORDER — KETOROLAC TROMETHAMINE 30 MG/ML IJ SOLN
INTRAMUSCULAR | Status: AC
  Filled 2023-02-06: qty 1

## 2023-02-06 MED ORDER — CHLORHEXIDINE GLUCONATE 0.12 % MT SOLN
OROMUCOSAL | Status: AC
Start: 2023-02-06 — End: ?
  Filled 2023-02-06: qty 15

## 2023-02-06 MED ORDER — PANTOPRAZOLE SODIUM 40 MG PO TBEC
40.0000 mg | DELAYED_RELEASE_TABLET | Freq: Every day | ORAL | Status: DC
  Administered 2023-02-06 – 2023-02-08 (×3): 40 mg via ORAL
  Filled 2023-02-06 (×3): qty 1

## 2023-02-06 MED ORDER — LIDOCAINE HCL (PF) 2 % IJ SOLN
INTRAMUSCULAR | Status: AC
  Filled 2023-02-06: qty 5

## 2023-02-06 MED ORDER — METOPROLOL TARTRATE 5 MG/5ML IV SOLN
INTRAVENOUS | Status: AC
  Filled 2023-02-06: qty 5

## 2023-02-06 MED ORDER — HYDROMORPHONE HCL 1 MG/ML IJ SOLN
INTRAMUSCULAR | Status: AC
  Filled 2023-02-06: qty 1

## 2023-02-06 MED ORDER — ONDANSETRON HCL 4 MG PO TABS
4.0000 mg | ORAL_TABLET | Freq: Four times a day (QID) | ORAL | Status: DC | PRN
Start: 2023-02-06 — End: 2023-02-08

## 2023-02-06 MED ORDER — LABETALOL HCL 5 MG/ML IV SOLN
INTRAVENOUS | Status: AC
  Filled 2023-02-06: qty 4

## 2023-02-06 MED ORDER — SODIUM CHLORIDE (PF) 0.9 % IJ SOLN
INTRAMUSCULAR | Status: DC | PRN
  Administered 2023-02-06: 71 mL

## 2023-02-06 MED ORDER — PROPOFOL 10 MG/ML IV BOLUS
INTRAVENOUS | Status: DC | PRN
  Administered 2023-02-06 (×2): 40 mg via INTRAVENOUS
  Administered 2023-02-06: 100 mg via INTRAVENOUS

## 2023-02-06 MED ORDER — OXYCODONE HCL 5 MG PO TABS
5.0000 mg | ORAL_TABLET | ORAL | Status: DC | PRN
Start: 2023-02-06 — End: 2023-02-08

## 2023-02-06 MED ORDER — MENTHOL 3 MG MT LOZG: 1.0000 | LOZENGE | OROMUCOSAL | Status: DC | PRN

## 2023-02-06 MED ORDER — OXYCODONE HCL 5 MG PO TABS: 5.0000 mg | ORAL_TABLET | Freq: Once | ORAL | Status: DC | PRN

## 2023-02-06 MED ORDER — PHENYLEPHRINE HCL-NACL 20-0.9 MG/250ML-% IV SOLN
INTRAVENOUS | Status: DC | PRN
  Administered 2023-02-06: 15 ug/min via INTRAVENOUS

## 2023-02-06 MED ORDER — SUGAMMADEX SODIUM 200 MG/2ML IV SOLN
INTRAVENOUS | Status: DC | PRN
  Administered 2023-02-06: 200 mg via INTRAVENOUS

## 2023-02-06 MED ORDER — PROPOFOL 500 MG/50ML IV EMUL
INTRAVENOUS | Status: DC | PRN
  Administered 2023-02-06: 20 ug/kg/min via INTRAVENOUS

## 2023-02-06 MED ORDER — SODIUM CHLORIDE 0.9 % IV SOLN: INTRAVENOUS | Status: DC | PRN

## 2023-02-06 MED ORDER — SURGIPHOR WOUND IRRIGATION SYSTEM - OPTIME: TOPICAL | Status: DC | PRN

## 2023-02-06 MED ORDER — ACETAMINOPHEN 10 MG/ML IV SOLN
INTRAVENOUS | Status: DC | PRN
  Administered 2023-02-06: 1000 mg via INTRAVENOUS

## 2023-02-06 MED ORDER — SODIUM CHLORIDE 0.9 % IV SOLN: INTRAVENOUS | Status: DC

## 2023-02-06 MED ORDER — HYDROMORPHONE HCL 1 MG/ML IJ SOLN
0.5000 mg | INTRAMUSCULAR | Status: DC | PRN
  Administered 2023-02-06: 1 mg via INTRAVENOUS
  Administered 2023-02-06 (×2): 0.5 mg via INTRAVENOUS
  Administered 2023-02-07 (×2): 1 mg via INTRAVENOUS
  Administered 2023-02-07: 0.5 mg via INTRAVENOUS
  Administered 2023-02-08: 1 mg via INTRAVENOUS
  Filled 2023-02-06 (×5): qty 1

## 2023-02-06 MED ORDER — TRANEXAMIC ACID-NACL 1000-0.7 MG/100ML-% IV SOLN
1000.0000 mg | INTRAVENOUS | Status: AC
  Administered 2023-02-06 (×2): 1000 mg via INTRAVENOUS

## 2023-02-06 MED ORDER — DOCUSATE SODIUM 100 MG PO CAPS
100.0000 mg | ORAL_CAPSULE | Freq: Two times a day (BID) | ORAL | Status: DC
  Administered 2023-02-06 – 2023-02-08 (×4): 100 mg via ORAL
  Filled 2023-02-06 (×4): qty 1

## 2023-02-06 MED ORDER — METOPROLOL SUCCINATE ER 50 MG PO TB24: 50.0000 mg | ORAL_TABLET | ORAL | Status: DC

## 2023-02-06 MED ORDER — ONDANSETRON HCL 4 MG/2ML IJ SOLN
INTRAMUSCULAR | Status: AC
  Filled 2023-02-06: qty 2

## 2023-02-06 MED ORDER — BUPIVACAINE-EPINEPHRINE (PF) 0.25% -1:200000 IJ SOLN
INTRAMUSCULAR | Status: AC
  Filled 2023-02-06: qty 30

## 2023-02-06 MED ORDER — HYDRALAZINE HCL 20 MG/ML IJ SOLN
5.0000 mg | Freq: Four times a day (QID) | INTRAMUSCULAR | Status: DC | PRN
  Administered 2023-02-06: 5 mg via INTRAVENOUS

## 2023-02-06 MED ORDER — LORAZEPAM 1 MG PO TABS
1.0000 mg | ORAL_TABLET | ORAL | Status: DC | PRN
  Administered 2023-02-07: 2 mg via ORAL
  Filled 2023-02-06: qty 2

## 2023-02-06 MED ORDER — CEFAZOLIN SODIUM-DEXTROSE 2-4 GM/100ML-% IV SOLN
2.0000 g | INTRAVENOUS | Status: AC
  Administered 2023-02-06: 2 g via INTRAVENOUS

## 2023-02-06 MED ORDER — PHENYLEPHRINE 80 MCG/ML (10ML) SYRINGE FOR IV PUSH (FOR BLOOD PRESSURE SUPPORT)
PREFILLED_SYRINGE | INTRAVENOUS | Status: DC | PRN
  Administered 2023-02-06: 160 ug via INTRAVENOUS
  Administered 2023-02-06: 80 ug via INTRAVENOUS

## 2023-02-06 MED ORDER — METOCLOPRAMIDE HCL 5 MG PO TABS: 5.0000 mg | ORAL_TABLET | Freq: Three times a day (TID) | ORAL | Status: DC | PRN

## 2023-02-06 SURGICAL SUPPLY — 79 items
BLADE PATELLA W-PILOT HOLE 35 (BLADE) IMPLANT
BLADE SAGITTAL AGGR TOOTH XLG (BLADE) IMPLANT
BLADE SAW SAG 25X90X1.19 (BLADE) ×1 IMPLANT
BLADE SAW SAG 29X58X.64 (BLADE) ×1 IMPLANT
BNDG ELASTIC 6INX 5YD STR LF (GAUZE/BANDAGES/DRESSINGS) ×1 IMPLANT
BOWL CEMENT MIX W/ADAPTER (MISCELLANEOUS) ×1 IMPLANT
CABLE CERLAGE W/CRIMP 1.8 (Cable) IMPLANT
CEMENT BONE R 1X40 (Cement) ×2 IMPLANT
CHLORAPREP W/TINT 26 (MISCELLANEOUS) ×2 IMPLANT
COMP TIB CMT KNEE G LT (Joint) ×1 IMPLANT
COMPONENT TIB CMT KNEE G LT (Joint) IMPLANT
COOLER POLAR GLACIER W/PUMP (MISCELLANEOUS) ×1 IMPLANT
CUFF TOURN SGL QUICK 24 (TOURNIQUET CUFF) ×1
CUFF TOURN SGL QUICK 30 (TOURNIQUET CUFF)
CUFF TRNQT CYL 24X4X16.5-23 (TOURNIQUET CUFF) IMPLANT
CUFF TRNQT CYL 30X4X21-28X (TOURNIQUET CUFF) IMPLANT
DERMABOND ADVANCED .7 DNX12 (GAUZE/BANDAGES/DRESSINGS) ×1 IMPLANT
DRAPE INCISE IOBAN 66X60 STRL (DRAPES) IMPLANT
DRAPE SHEET LG 3/4 BI-LAMINATE (DRAPES) ×1 IMPLANT
DRSG EMULSION OIL 3X8 NADH (GAUZE/BANDAGES/DRESSINGS) IMPLANT
DRSG MEPILEX SACRM 8.7X9.8 (GAUZE/BANDAGES/DRESSINGS) ×1 IMPLANT
DRSG OPSITE POSTOP 4X10 (GAUZE/BANDAGES/DRESSINGS) IMPLANT
DRSG OPSITE POSTOP 4X8 (GAUZE/BANDAGES/DRESSINGS) IMPLANT
ELECT REM PT RETURN 9FT ADLT (ELECTROSURGICAL) ×1
ELECTRODE REM PT RTRN 9FT ADLT (ELECTROSURGICAL) ×1 IMPLANT
GAUZE XEROFORM 1X8 LF (GAUZE/BANDAGES/DRESSINGS) IMPLANT
GLOVE BIO SURGEON STRL SZ8 (GLOVE) ×1 IMPLANT
GLOVE BIOGEL PI IND STRL 8 (GLOVE) ×1 IMPLANT
GLOVE PI ORTHO PRO STRL 7.5 (GLOVE) ×2 IMPLANT
GLOVE PI ORTHO PRO STRL SZ8 (GLOVE) ×2 IMPLANT
GLOVE SURG SYN 7.5 E (GLOVE) ×1 IMPLANT
GLOVE SURG SYN 7.5 PF PI (GLOVE) ×1 IMPLANT
GOWN SRG XL LVL 3 NONREINFORCE (GOWNS) ×1 IMPLANT
GOWN STRL NON-REIN TWL XL LVL3 (GOWNS) ×1
GOWN STRL REUS W/ TWL LRG LVL3 (GOWN DISPOSABLE) ×1 IMPLANT
GOWN STRL REUS W/ TWL XL LVL3 (GOWN DISPOSABLE) ×1 IMPLANT
GOWN STRL REUS W/TWL LRG LVL3 (GOWN DISPOSABLE) ×1
GOWN STRL REUS W/TWL XL LVL3 (GOWN DISPOSABLE) ×1
HOOD PEEL AWAY T7 (MISCELLANEOUS) ×2 IMPLANT
IV NS IRRIG 3000ML ARTHROMATIC (IV SOLUTION) ×1 IMPLANT
KIT PREVENA INCISION MGT 13 (CANNISTER) IMPLANT
KIT TURNOVER KIT A (KITS) ×1 IMPLANT
LINER TIB POST GH/6-9 10 LT (Liner) IMPLANT
MANIFOLD NEPTUNE II (INSTRUMENTS) ×1 IMPLANT
MARKER SKIN DUAL TIP RULER LAB (MISCELLANEOUS) ×1 IMPLANT
MAT ABSORB FLUID 56X50 GRAY (MISCELLANEOUS) ×1 IMPLANT
NDL HYPO 21X1.5 SAFETY (NEEDLE) ×1 IMPLANT
NEEDLE HYPO 21X1.5 SAFETY (NEEDLE) ×1 IMPLANT
PACK TOTAL KNEE (MISCELLANEOUS) ×1 IMPLANT
PAD ABD DERMACEA PRESS 5X9 (GAUZE/BANDAGES/DRESSINGS) IMPLANT
PAD ARMBOARD 7.5X6 YLW CONV (MISCELLANEOUS) ×3 IMPLANT
PAD WRAPON POLAR KNEE (MISCELLANEOUS) ×1 IMPLANT
PADDING CAST BLEND 4X4 STRL (MISCELLANEOUS) IMPLANT
PENCIL SMOKE EVACUATOR (MISCELLANEOUS) ×1 IMPLANT
PIN DRILL HDLS TROCAR 75 4PK (PIN) IMPLANT
PROSTHESIS FEM CMT STD SZ9 LT (Miscellaneous) IMPLANT
PULSAVAC PLUS IRRIG FAN TIP (DISPOSABLE) ×1
SCREW FEMALE HEX FIX 25X2.5 (ORTHOPEDIC DISPOSABLE SUPPLIES) IMPLANT
SCREW HEX HEADED 3.5X27 DISP (ORTHOPEDIC DISPOSABLE SUPPLIES) IMPLANT
SLEEVE SCD COMPRESS KNEE MED (STOCKING) ×1 IMPLANT
SOLUTION IRRIG SURGIPHOR (IV SOLUTION) ×1 IMPLANT
STEM FEM OFFSET 6 EXT 16X135 (Miscellaneous) IMPLANT
STEM FEM PROS OS 14X135X6 (Stem) IMPLANT
STEM POLY PAT PLY 32M KNEE (Knees) IMPLANT
SUT STRATA 1 CT-1 DLB (SUTURE) ×1
SUT STRATAFIX 14 PDO 48 VLT (SUTURE) ×1 IMPLANT
SUT STRATAFIX PDO 1 14 VIOLET (SUTURE) ×1
SUT VIC AB 0 CT1 36 (SUTURE) ×1 IMPLANT
SUT VIC AB 2-0 CT2 27 (SUTURE) ×2 IMPLANT
SUT VICRYL 1-0 27IN ABS (SUTURE) ×1
SUTURE STRATA SPIR 4-0 18 (SUTURE) ×1 IMPLANT
SUTURE VICRYL 1-0 27IN ABS (SUTURE) ×1 IMPLANT
SYR 30ML LL (SYRINGE) ×2 IMPLANT
TAPE CLOTH 3X10 WHT NS LF (GAUZE/BANDAGES/DRESSINGS) ×1 IMPLANT
TIP FAN IRRIG PULSAVAC PLUS (DISPOSABLE) ×1 IMPLANT
TOWEL OR 17X26 4PK STRL BLUE (TOWEL DISPOSABLE) IMPLANT
TRAP FLUID SMOKE EVACUATOR (MISCELLANEOUS) ×1 IMPLANT
WATER STERILE IRR 1000ML POUR (IV SOLUTION) ×1 IMPLANT
WRAPON POLAR PAD KNEE (MISCELLANEOUS) ×1

## 2023-02-06 NOTE — Op Note (Signed)
Patient Name: Sergio Gates  MWU:[id number]  Pre-Operative Diagnosis: Left knee Osteoarthritis with severe bone loss and valgus deformity  Post-Operative Diagnosis: (same)  Procedure: Left Total Knee Arthroplasty  Components/Implants: Femur: Persona Revision Size 9 w/ 16x196mm x19mm offset splined stem    Tibia: Persona Size G w/ 14x135mm x 6mm offset splined stem  Poly: 12mm CPS  Patella: 32x8.53mm symmetric x1 Cable-ready 1.52mm cerclage cable  Femoral Valgus Cut Angle: 5 degrees  Distal Femoral Re-cut: none  Patella Resurfacing: yes   Date of Surgery: 02/06/2023  Surgeon: Reinaldo Berber MD  Assistant: Amador Cunas PA (present and scrubbed throughout the case, critical for assistance with exposure, retraction, instrumentation, and closure)   Anesthesiologist: Adans  Anesthesia: General   Tourniquet Time: 118 min  EBL: 75cc  IVF: 1000cc  Complications: None   Brief history: The patient is a 62 year old male with a history of osteoarthritis of the left knee with pain limiting their range of motion and activities of daily living, which has failed multiple attempts at conservative therapy.  The risks and benefits of total knee arthroplasty as definitive surgical treatment were discussed with the patient, who opted to proceed with the operation.  After outpatient medical clearance and optimization was completed the patient was admitted to [facility] for the procedure.  All preoperative films were reviewed and an appropriate surgical plan was made prior to surgery. Preoperative range of motion was -20 to 135 with 20 degrees of hyperextension.  Patient was identified as having a severe valgus deformity with lateral bone loss.    Description of procedure: The patient was brought to the operating room where laterality was confirmed by all those present to be the left side.   Spinal anesthesia was administered and the patient received an intravenous dose of  antibiotics for surgical prophylaxis and a dose of tranexamic acid.  Patient is positioned supine on the operating room table with all bony prominences well-padded.  A well-padded tourniquet was applied to the left thigh.  The knee was then prepped and draped in usual sterile fashion with multiple layers of adhesive and nonadhesive drapes.  All of those present in the operating room participated in a surgical timeout laterality and patient were confirmed.   An Esmarch was wrapped around the extremity and the leg was elevated and the knee flexed.  The tourniquet was inflated to a pressure of 275 mmHg. The Esmarch was removed and the leg was brought down to full extension.  The patella and tibial tubercle identified and outlined using a marking pen and a midline skin incision was made with a knife carried through the subcutaneous tissue down to the extensor retinaculum.  After exposure of the extensor mechanism the medial parapatellar arthrotomy was performed with a scalpel and electrocautery extending down medial and distal to the tibial tubercle taking care to avoid incising the patellar tendon.   A standard medial release was performed over the proximal tibia.  The knee was brought into extension in order to excise the fat pad taking care not to damage the patella tendon.  The superior soft tissue was removed from the anterior surface of the distal femur to visualize for the procedure.  There is significant chronic appearing scar tissue with normal-appearing synovial fluid.  The superior patellar pouch was expanded chronically of the anterior thigh.  The knee was then brought into flexion with the patella subluxed laterally and subluxing the tibia anteriorly.  The ACL and PCL were transected and removed with electrocautery and  additional soft tissue was removed from the proximal surface of the tibia to fully expose.  There is found to be significant posterior lateral tibial bone loss with large osteophytes  along the lateral aspect of the tibia.  An intramedullary tibial cutting guide was carefully driven into the tibia down the tibial canal.  Resection guide was then intact with a 0 degree cut off the tibial guide.  The resection guide was then pinned to the proximal tibia and the proximal tibial surface was resected with an oscillating saw.  Careful attention was paid to ensure the blade did not disrupt any of the soft tissues including any lateral or medial ligament.  Attention was then turned to the femur, with the knee slightly flexed a opening drill was used to enter the medullary canal of the femur.  After removing the drill marrow was suctioned out to decompress the distal femur.  An intramedullary femoral guide was then inserted into the drill hole and the alignment guide was seated firmly against the distal end of the medial femoral condyle.  The distal femoral cutting guide was then attached and pinned securely to the anterior surface of the femur and the intramedullary rod and alignment guide was removed.  Distal femur resection was then performed with an oscillating saw with retractors protecting medial and laterally.   The distal cutting block was then removed and the extension gap was checked with a spacer.  Extension gap was found to be appropriately sized to accommodate the spacer block.  The femoral sizing guide was then placed securely into the posterior condyles of the femur and the femoral size was measured and determined to be 9.  The size 9; 4-in-1 cutting guide was placed in position and secured with 2 pins.  The anterior posterior and chamfer resections were then performed with an oscillating saw.  Bony fragments and osteophytes were then removed.  After removing the cut pieces off of the femur there was found to be a very large cyst in the posterior lateral femoral condyle.  The cyst was carefully curetted out and remaining cyst tissue was removed with  electrocautery. Using a lamina  spreader the posterior medial and lateral condyles were checked for additional osteophytes and posterior soft tissue remnants.  Any remaining meniscus was removed at this time.  Periarticular injection was performed in the meniscal rims and posterior capsule with aspiration performed to ensure no intravascular injection.   The tibia was then reexposed and the tibial canal was carefully reamed up as well as the femoral canal with hand reaming up to a size 14 mm x 135 mm stem in the tibia allowed for good fit and fill.  The femur was reamed up to 16 mm x 135 mm stem allowing good fit and fill.  An offset guide was then used to carefully center the tibial tray over the intramedullary guide.  The tibia was marked for keel cuts and the keel cuts were made into the tibia using the keel impactor.  A trial tibial component with a 14 mm stem was then placed with 6 mm of offset allowing good coverage of the tibia.  Attention was then turned back to the femur the femoral canal was carefully till reamed and a 6 mm offset stem allowed for good medial and lateral position and anterior posterior position of the femoral component.  During this application a very small crack was noted in the anterior femoral metaphysis just anterior to the reaming hole.  There was no  propagation proximally and the decision was made to prophylactically place a cable to prevent any further propagation.  A cerclage cable was carefully placed from medial to lateral staying on bone the entire time and avoiding any neurovascular structures.  Once the wire was passed the tourniquet was dropped with the wire under tension and a good distal pulse was palpated.  The wire was tensioned and the pulse remained and the tourniquet was put back up.  A trial femur was then placed without any displacement of any cracks or any further injuries.  The femoral box was then cut with an oscillating saw and the extra bone carefully removed.  A trial polyethylene was  placed and the knee was taken through range of motion.      The knee achieved full extension with no hyperextension and was found to be balanced in flexion and extension with the trials in place.  The knee was then brought into full extension the patella was everted and held with 2 Kocher clamps.  The articular surface of the patella was then resected with an patella reamer and saw after careful measurement with a caliper.  The patella was then prepared with the drill guide and a trial patella was placed.  The knee was then taken through range of motion and it was found that the patella articulated appropriately with the trochlea and good patellofemoral motion without subluxation.    The correct final components for implantation were confirmed and opened by the circulator nurse.  The components were assembled matching the offsets of the trials which were placed in the back table.  The prepared surfaces of the patella femur and tibia were cleaned with pulsatile lavage to remove all blood fat and other material and then the surfaces were dried.  2 bags of cement were mixed under vacuum and the components were cemented into place.  Cement was carefully packed into the remaining bony defect in the posterior lateral tibia as well as in the lateral distal femoral condyle to fill in the cyst void.  Excess cement was removed with curettes and forceps. A trial polyethylene tibial component was placed and the knee was brought into extension to allow the cement to set.  At this time the periarticular injection cocktail was placed in the soft tissues surrounding the knee.  After full curing of the cement the balance of the knee was checked again and the final polyethylene size was confirmed. The tibial component was irrigated and locking mechanism checked to ensure it was clear of debris. The real polyethylene tibial component was implanted and the knee was brought through a range of motion.   The knee was then irrigated  with copious amount of normal saline via pulsatile lavage to remove all loose bodies and other debris.  The knee was then irrigated with surgiphor betadine based wash and reirrigated with saline.  The tourniquet was then dropped and all bleeding vessels were identified and coagulated.  The arthrotomy was approximated with #1 Vicryl and closed with #2 Quill suture.  The knee was brought into slight flexion and the subcutaneous tissues were closed with 0 Vicryl, 2-0 Vicryl and a running subcuticular 4-0 stratafix barbed suture.  While removing the Ioban a small area of skin tear very superficially occurred at the superior aspect of the incision.  The decision was made to avoid Dermabond at this time and a Vaseline gauze based dressing was placed and the knee was carefully wrapped with gauze, cast padding and Ace wrap.  a Ted stocking, and a cryotherapy cuff were also placed.  X-rays were taken in the operating room showing good implant position without any evidence of periprosthetic fracture.  Sponge, needle, and Lap counts were all correct at the end of the case.   The patient was transferred off of the operating room table to a hospital bed, good pulses were found distally on the operative side.  The patient was transferred to the recovery room in stable condition.   Patient's family was made aware of the small skin tear and the small crack in the femur which was addressed during the surgery.  All their questions were answered.  The patient may weight-bear as tolerated after the surgery with routine postoperative total knee protocol.

## 2023-02-06 NOTE — Anesthesia Procedure Notes (Signed)
Procedure Name: Intubation Date/Time: 02/06/2023 1:35 PM  Performed by: Lily Lovings, CRNAPre-anesthesia Checklist: Patient identified, Patient being monitored, Timeout performed, Emergency Drugs available and Suction available Patient Re-evaluated:Patient Re-evaluated prior to induction Oxygen Delivery Method: Circle system utilized Preoxygenation: Pre-oxygenation with 100% oxygen Induction Type: IV induction Ventilation: Mask ventilation without difficulty and Oral airway inserted - appropriate to patient size Laryngoscope Size: Mac, 4 and McGrath Grade View: Grade I Tube type: Oral Tube size: 7.0 mm Number of attempts: 1 Airway Equipment and Method: Stylet Placement Confirmation: ETT inserted through vocal cords under direct vision, positive ETCO2 and breath sounds checked- equal and bilateral Secured at: 22 cm Tube secured with: Tape Dental Injury: Teeth and Oropharynx as per pre-operative assessment  Comments: Elective mcgrath
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
## 2023-02-06 NOTE — Interval H&P Note (Signed)
Patient history and physical updated. Consent reviewed including risks, benefits, and alternatives to surgery. Patient agrees with above plan to proceed with left total knee arthroplasty.

## 2023-02-06 NOTE — Anesthesia Postprocedure Evaluation (Signed)
Anesthesia Post Note  Patient: Sergio Gates  Procedure(s) Performed: TOTAL KNEE ARTHROPLASTY (Left: Knee)  Patient location during evaluation: PACU Anesthesia Type: Spinal Level of consciousness: awake and alert Pain management: pain level controlled Vital Signs Assessment: post-procedure vital signs reviewed and stable Respiratory status: spontaneous breathing, nonlabored ventilation, respiratory function stable and patient connected to nasal cannula oxygen Cardiovascular status: blood pressure returned to baseline and stable Postop Assessment: no apparent nausea or vomiting Anesthetic complications: no   No notable events documented.   Last Vitals:  Vitals:   02/06/23 1751 02/06/23 1805  BP: (!) 165/92 (!) 167/89  Pulse: 89 93  Resp: 20 16  Temp:  36.4 C  SpO2: 100% 100%    Last Pain:  Vitals:   02/06/23 1805  PainSc: 9                  Corinda Gubler

## 2023-02-06 NOTE — H&P (Signed)
History of Present Illness: The patient is an 62 y.o. male here for history and physical for left total knee arthroplasty with Dr. Audelia Acton on 02/07/2023. Patient has severe pain, degenerative changes and instability of his left knee. He has severe valgus deformity with laxity of the left knee joint. He has had very little relief with conservative treatment. He walks with a cane. Mobility is limited. He gets some stability relief with a knee brace but does not help much with his pain. Despite cortisone injections, gel injections, PT, bracing and aspirations of fluid he continues to have short-term relief. He has seen Dr. Audelia Acton, discussed total knee arthroplasty and agreed and consented the procedure.   The patient is now a non-smoker, he does drink alcohol daily and will need to be carefully watched during any perioperative period, he does occasionally use Suboxone for pain relief.  Past Medical History: Past Medical History:  Diagnosis Date  Asthma, unspecified asthma severity, unspecified whether complicated, unspecified whether persistent (HHS-HCC)  Chronic obstructive lung disease (CMS/HHS-HCC)  GERD (gastroesophageal reflux disease)  Hypertension   Past Surgical History: Past Surgical History:  Procedure Laterality Date  hip and femur surgery   Past Family History: Family History  Problem Relation Age of Onset  High blood pressure (Hypertension) Mother  Arthritis Mother  No Known Problems Father  Kidney disease Sister  High blood pressure (Hypertension) Brother  Arthritis Other  Gout Other   Medications: Current Outpatient Medications  Medication Sig Dispense Refill  acetaminophen (TYLENOL) 500 MG tablet Take 1,000 mg by mouth every 6 (six) hours as needed for Pain  adalimumab (HUMIRA) 40 mg/0.4 mL pen injector kit Inject 40 mg subcutaneously every 14 (fourteen) days 2 kit 0  albuterol 90 mcg/actuation inhaler Inhale 2 inhalations into the lungs every 4 (four) hours as needed   aspirin 81 MG EC tablet Take 81 mg by mouth once daily  fluticasone-umeclidinium-vilanterol (TRELEGY ELLIPTA) 100-62.5-25 mcg inhaler Inhale 1 inhalation into the lungs once daily  metoprolol succinate (TOPROL-XL) 50 MG XL tablet Take 50 mg by mouth once daily  ondansetron (ZOFRAN) 4 MG tablet Take 4 mg by mouth every 6 (six) hours as needed  ondansetron (ZOFRAN-ODT) 4 MG disintegrating tablet Take 1 tablet by mouth every 6 (six) hours as needed  oxyBUTYnin (DITROPAN-XL) 10 MG XL tablet Take 10 mg by mouth once daily  rosuvastatin (CRESTOR) 40 MG tablet TAKE 1 TABLET BY MOUTH EVERY EVENING WITH MEAL  sodium chloride 1,000 mg soluble tablet Take 1 g by mouth once daily  TALICIA 10-250-12.5 mg CpID TAKE 4 CAPSULES BY MOUTH EVERY 8 HOURS  varenicline (CHANTIX) 1 mg tablet Take 1 tablet (1 mg total) by mouth 2 (two) times daily Take 1 tablet once daily with breakfast for 7 days, then increase to 1 tablet twice daily with breakfast and dinner for 11 weeks. 161 tablet 1   No current facility-administered medications for this visit.   Allergies: No Known Allergies   Visit Vitals: Vitals:  02/01/23 1324  BP: (!) 140/82    Review of Systems:  A comprehensive 14 point ROS was performed, reviewed, and the pertinent orthopaedic findings are documented in the HPI.  Physical Exam: General:  Well developed, well nourished, no apparent distress, normal affect, presents in a wheelchair. Has a cane in hand. Able to ambulate with severe valgus thrust to the left knee  HEENT: Head normocephalic, atraumatic, PERRL.   Abdomen: Soft, non tender, non distended, Bowel sounds present.  Heart: Examination of the heart reveals  regular, rate, and rhythm. There is no murmur noted on ascultation. There is a normal apical pulse.  Lungs: Lungs are clear to auscultation. There is no wheeze, rhonchi, or crackles. There is normal expansion of bilateral chest walls.   Comprehensive Knee Exam: Gait  Non-antalgic and fluid  Alignment Bilateral valgus thrust with knee hyperextension during gait bilaterally   Inspection Left  Skin Left knee -There are various bruises around the knee and leg in different shades of purple and red, no sign of infection, no wounds.   Soft Tissue No focal soft tissue swelling No focal soft tissue swelling  Quad Atrophy None None   Palpation  Right Left  Tenderness Medial and lateral joint line tenderness to palpation Medial and lateral joint line tenderness palpation  Crepitus + patellofemoral and tibiofemoral crepitus + patellofemoral and tibiofemoral crepitus  Effusion None None   Range of Motion Right Left  Flexion -10-135                                 -20-135  Extension 10 degree hyperextension 20 degree hyperextension   Ligamentous Exam Right Left  Lachman 2+ with endpoint 2+ with soft endpoint  Valgus 0 2+ with endpoint 2+ no discernible endpoint bony contact made prior to soft tissue endpoint  Valgus 30 2+ 2+  Varus 0 Correctable with endpoint Correctable with endpoint  Varus 30 Correctable Correctable  Anterior Drawer 2+ with endpoint 2+ with soft endpoint  Posterior Drawer Normal Normal   Meniscal Exam Right Left  Hyperflexion Test Negative Negative  Hyperextension Test Negative Negative  McMurray's Positive Positive    Neurovascular Right Left  Quadriceps Strength 5/5 able to straight leg raise 5/5 able to straight leg raise  Hamstring Strength 5/5 5/5  Hip Abductor Strength 4/5 4/5  Distal Motor Normal Normal  Distal Sensory Normal light touch sensation Normal light touch sensation  Distal Pulses Normal Normal   Imaging Studies: I have reviewed AP, lateral, sunrise, and flexed PA weightbearing x-rays of the left knee from 12/27/2022 show severe degenerative changes with lateral joint space narrowing with bony erosion of the proximal tibia, sclerosis, osteophyte formation, and subchondral cyst formation. AP sunrise and flexed  PA views of the right knee shows severe lateral joint space narrowing with bony erosion sawtooth deformity of the lateral proximal tibia, sclerosis, osteophyte formation as well as severe patellofemoral degeneration in both knees. There is a previous partially visualized femoral rod at the end of the right femur which ends in the distal metaphysis just below the joint surface. No fractures or dislocations noted in either knee.  Assessment:  ICD-10-CM  1. Primary osteoarthritis of left knee M17.12  Left knee osteoarthritis, left knee instability  Plan: [place] is a 62 year old male who presents with left knee bone on bone arthritis with severe valgus deformity and left knee laxity with 20 degrees of hyperextension. Patient has had no improvement with conservative treatment of his left knee. His pain and instability interferes with his quality of life and activities a day living. Risks, benefits, complications of a left total knee arthroplasty have discussed with the patient. Patient has agreed to consent the procedure with Dr. Audelia Acton on 02/06/2023.  The hospitalization and post-operative care and rehabilitation were also discussed. The use of perioperative antibiotics and DVT prophylaxis were discussed. The risk, benefits and alternatives to a surgical intervention were discussed at length with the patient. The patient was also advised of  risks related to the medical comorbidities and elevated body mass index (BMI). A lengthy discussion took place to review the most common complications including but not limited to: stiffness, loss of function, complex regional pain syndrome, deep vein thrombosis, pulmonary embolus, heart attack, stroke, infection, wound breakdown, numbness, intraoperative fracture, damage to nerves, tendon,muscles, arteries or other blood vessels, death and other possible complications from anesthesia. The patient was told that we will take steps to minimize these risks by using sterile  technique, antibiotics and DVT prophylaxis when appropriate and follow the patient postoperatively in the office setting to monitor progress. The possibility of recurrent pain, no improvement in pain and actual worsening of pain were also discussed with the patient. We do specific conversation about his valgus deformity and his increased risk of peroneal nerve injury during deformity correction. We also discussed his hyperlaxity and hyperextension of the knee. We will likely need to use revision style components and we discussed revision style diaphyseal fit components and more constraint within the knee in order to get a stable knee at the end of the surgery. We also discussed the use of augments for his surgery due to bone loss.   He is ok with the use of revision style components and metal augments, no history of metal allergy noted. Does have a history of pain medication overuse, no longer taking narcotics at this time per patient report. Does report daily light beer drinking and we discussed using CIWA after surgery as a precaution. We did discuss with his thin skin and easy bruising in the setting of use of revision components that we will likely plan to keep him on extended oral antibiotic treatment post operatively.  All questions answered he agrees with above plan for left total knee arthroplasty today.

## 2023-02-06 NOTE — Transfer of Care (Signed)
Immediate Anesthesia Transfer of Care Note  Patient: Sergio Gates  Procedure(s) Performed: TOTAL KNEE ARTHROPLASTY (Left: Knee)  Patient Location: PACU  Anesthesia Type:General  Level of Consciousness: awake, drowsy, and patient cooperative  Airway & Oxygen Therapy: Patient Spontanous Breathing  Post-op Assessment: Report given to RN and Post -op Vital signs reviewed and stable  Post vital signs: Reviewed and stable  Last Vitals:  Vitals Value Taken Time  BP 155/96 02/06/23 1656  Temp    Pulse 97 02/06/23 1659  Resp 16 02/06/23 1659  SpO2 98 % 02/06/23 1659  Vitals shown include unfiled device data.  Last Pain:  Vitals:   02/06/23 1137  PainSc: 0-No pain      Patients Stated Pain Goal: 0 (02/06/23 1137)  Complications: No notable events documented.

## 2023-02-06 NOTE — Progress Notes (Signed)
Patient very anxious and nauseated. Blood pressure elevated. Dr Pernell Dupre aware, orders placed for labetolol and versed.

## 2023-02-06 NOTE — Progress Notes (Signed)
Patient awake/alert x4. No s/s of agitation, nausea , tremors tachycardia.  Patient is HTN but states he did not take his bp meds today.  Dr. Suzan Slick aware:  medications ordered. Medicated for pain left knee.
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
## 2023-02-07 ENCOUNTER — Encounter: Payer: Self-pay | Admitting: Orthopedic Surgery

## 2023-02-07 DIAGNOSIS — M1712 Unilateral primary osteoarthritis, left knee: Secondary | ICD-10-CM | POA: Diagnosis not present

## 2023-02-07 LAB — BASIC METABOLIC PANEL
Anion gap: 5 (ref 5–15)
BUN: 9 mg/dL (ref 6–20)
CO2: 24 mmol/L (ref 22–32)
Calcium: 7.8 mg/dL — ABNORMAL LOW (ref 8.9–10.3)
Chloride: 99 mmol/L (ref 98–111)
Creatinine, Ser: 0.89 mg/dL (ref 0.61–1.24)
GFR, Estimated: >60 mL/min (ref >60)
Glucose, Bld: 126 mg/dL — ABNORMAL HIGH (ref 70–99)
Potassium: 3.8 mmol/L (ref 3.5–5.1)
Sodium: 128 mmol/L — ABNORMAL LOW (ref 135–145)

## 2023-02-07 LAB — CBC
HCT: 27 % — ABNORMAL LOW (ref 39.0–52.0)
Hemoglobin: 9.2 g/dL — ABNORMAL LOW (ref 13.0–17.0)
MCH: 32.1 pg (ref 26.0–34.0)
MCHC: 34.1 g/dL (ref 30.0–36.0)
MCV: 94.1 fL (ref 80.0–100.0)
Platelets: 336 10*3/uL (ref 150–400)
RBC: 2.87 MIL/uL — ABNORMAL LOW (ref 4.22–5.81)
RDW: 12.8 % (ref 11.5–15.5)
WBC: 10.4 10*3/uL (ref 4.0–10.5)
nRBC: 0 % (ref 0.0–0.2)

## 2023-02-07 MED ORDER — CELECOXIB 100 MG PO CAPS: 100.0000 mg | ORAL_CAPSULE | Freq: Two times a day (BID) | ORAL | 0 refills | Status: AC

## 2023-02-07 MED ORDER — ORAL CARE MOUTH RINSE: 15.0000 mL | OROMUCOSAL | Status: DC | PRN

## 2023-02-07 MED ORDER — ONDANSETRON HCL 4 MG PO TABS: 4.0000 mg | ORAL_TABLET | Freq: Four times a day (QID) | ORAL | 0 refills | Status: AC | PRN

## 2023-02-07 MED ORDER — OXYCODONE HCL 5 MG PO TABS: 5.0000 mg | ORAL_TABLET | Freq: Four times a day (QID) | ORAL | 0 refills | Status: AC | PRN

## 2023-02-07 MED ORDER — CEPHALEXIN 500 MG PO CAPS: 500.0000 mg | ORAL_CAPSULE | Freq: Four times a day (QID) | ORAL | 0 refills | Status: AC

## 2023-02-07 MED ORDER — DOCUSATE SODIUM 100 MG PO CAPS: 100.0000 mg | ORAL_CAPSULE | Freq: Two times a day (BID) | ORAL | 0 refills | Status: AC

## 2023-02-07 MED ORDER — ENOXAPARIN SODIUM 40 MG/0.4ML IJ SOSY: 40.0000 mg | PREFILLED_SYRINGE | INTRAMUSCULAR | 0 refills | Status: AC

## 2023-02-07 MED ORDER — METOPROLOL SUCCINATE ER 50 MG PO TB24
50.0000 mg | ORAL_TABLET | Freq: Every day | ORAL | Status: DC
  Administered 2023-02-07 – 2023-02-08 (×2): 50 mg via ORAL
  Filled 2023-02-07 (×2): qty 1

## 2023-02-07 MED ORDER — ENSURE ENLIVE PO LIQD
237.0000 mL | Freq: Two times a day (BID) | ORAL | Status: DC
  Administered 2023-02-07 – 2023-02-08 (×4): 237 mL via ORAL

## 2023-02-07 NOTE — Plan of Care (Signed)
  Problem: Clinical Measurements: Goal: Respiratory complications will improve Outcome: Progressing Goal: Cardiovascular complication will be avoided Outcome: Progressing   Problem: Nutrition: Goal: Adequate nutrition will be maintained Outcome: Not Progressing   

## 2023-02-07 NOTE — Evaluation (Signed)
Occupational Therapy Evaluation Patient Details Name: Sergio Gates MRN: [id number] DOB: 1960-12-17 Today's Date: 02/07/2023   History of Present Illness Pt is a 62 yo M diagnosed with left knee osteoarthritis with severe bone loss and valgus deformity and is s/p elective L TKA.  PMH includes: COPD, HTN, asthma, and GERD.   Clinical Impression   Pt was seen for OT evaluation this date. Prior to hospital admission, pt lives alone with his daughter available to assist. Pt was IND at baseline and working full time.   Pt presents to acute OT demonstrating impaired ADL performance and functional mobility 2/2 pain and weakness (See OT problem list for additional functional deficits). Pt currently requires CGA for STS and mobility in the room using RW. CGA for toilet transfer with BSC over toilet and use of grab bar. Edu on having family place [facility] over toilet upon return home as well. He demo LB dressing to doff/don sock on L foot with SUP in recliner. Educated on use of polar care system and assisted pt to don compression stocking onto LLE. Educated on AE/AD/DME for increased ease and safety with ADLs as well as to prevent increased pain. Pt would benefit from skilled OT services to address noted impairments and functional limitations (see below for any additional details) in order to maximize safety and independence while minimizing falls risk and caregiver burden. Do anticipate the need for follow up OT services upon acute hospital DC to ensure safety in home environment since daughter will be working during the day.        If plan is discharge home, recommend the following: A little help with walking and/or transfers;A little help with bathing/dressing/bathroom;Help with stairs or ramp for entrance;Assist for transportation;Assistance with cooking/housework    Functional Status Assessment  Patient has had a recent decline in their functional status and demonstrates the ability to make significant  improvements in function in a reasonable and predictable amount of time.  Equipment Recommendations  None recommended by OT (has needed equipment)    Recommendations for Other Services       Precautions / Restrictions Precautions Precautions: Fall Restrictions Weight Bearing Restrictions: Yes LLE Weight Bearing: Weight bearing as tolerated      Mobility Bed Mobility                    Transfers Overall transfer level: Needs assistance Equipment used: Rolling walker (2 wheels) Transfers: Sit to/from Stand Sit to Stand: Contact guard assist           General transfer comment: CGA for STS from recliner with verb cues for hand and L foot placement for transfer and mobility to the bathroom and back using RW      Balance Overall balance assessment: Needs assistance   Sitting balance-Leahy Scale: Normal     Standing balance support: Bilateral upper extremity supported, During functional activity Standing balance-Leahy Scale: Good                             ADL either performed or assessed with clinical judgement   ADL Overall ADL's : Needs assistance/impaired                     Lower Body Dressing: Supervision/safety Lower Body Dressing Details (indicate cue type and reason): to doff/don L sock seated in recliner Toilet Transfer: Contact guard assist;Grab bars;BSC/3in1 Toilet Transfer Details (indicate cue type and reason): BSC over toilet  Vision         Perception         Praxis         Pertinent Vitals/Pain Pain Assessment Pain Assessment: 0-10 Pain Score: 5  Pain Location: L knee Pain Descriptors / Indicators: Aching, Sore Pain Intervention(s): Monitored during session, Limited activity within patient's tolerance, Ice applied, Repositioned     Extremity/Trunk Assessment Upper Extremity Assessment Upper Extremity Assessment: Overall WFL for tasks assessed   Lower Extremity Assessment Lower  Extremity Assessment: Generalized weakness;LLE deficits/detail RLE Deficits / Details: RLE strength grossly WNL RLE Sensation: WNL RLE Coordination: WNL LLE Deficits / Details: s/p L TKR LLE: Unable to fully assess due to pain LLE Sensation: WNL       Communication Communication Communication: No apparent difficulties Cueing Techniques: Verbal cues;Tactile cues;Visual cues   Cognition Arousal: Alert Behavior During Therapy: WFL for tasks assessed/performed Overall Cognitive Status: Within Functional Limits for tasks assessed                                       General Comments       Exercises Other Exercises Other Exercises: OT provided education to pt on use of polar care system with pt verbalizing understanding. Other Exercises: Provided education on use of AE/AD/DME upon return home to maximize safety and IND and decrease pain with ADL performance.   Shoulder Instructions      Home Living Family/patient expects to be discharged to:: Private residence Living Arrangements: Children;Other relatives Available Help at Discharge: Family;Available PRN/intermittently Type of Home: Mobile home Home Access: Stairs to enter Entrance Stairs-Number of Steps: 4 Entrance Stairs-Rails: Right;Left (too wide for both) Home Layout: One level     Bathroom Shower/Tub: Chief Strategy Officer: Standard     Home Equipment: Cane - single point          Prior Functioning/Environment Prior Level of Function : Independent/Modified Independent             Mobility Comments: Ind amb community distances without an AD, works FT as heavy Clinical cytogeneticist, 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, started using SPC PRN over the last 1-2 weeks secondary to L knee pain ADLs Comments: Ind with ADLs        OT Problem List: Decreased strength;Pain      OT Treatment/Interventions: Self-care/ADL training;Therapeutic exercise;Therapeutic activities;Energy conservation;DME and/or AE  instruction;Patient/family education;Balance training    OT Goals(Current goals can be found in the care plan section) Acute Rehab OT Goals Patient Stated Goal: return home OT Goal Formulation: With patient Time For Goal Achievement: 02/21/23 Potential to Achieve Goals: Good ADL Goals Pt Will Perform Lower Body Bathing: with supervision;sitting/lateral leans;sit to/from stand Pt Will Perform Lower Body Dressing: with supervision;sitting/lateral leans;sit to/from stand Pt Will Transfer to Toilet: with supervision;ambulating;regular height toilet Pt Will Perform Toileting - Clothing Manipulation and hygiene: with supervision;sit to/from stand;sitting/lateral leans  OT Frequency: Min 1X/week    Co-evaluation              AM-PAC OT "6 Clicks" Daily Activity     Outcome Measure Help from another person eating meals?: None Help from another person taking care of personal grooming?: None Help from another person toileting, which includes using toliet, bedpan, or urinal?: A Little Help from another person bathing (including washing, rinsing, drying)?: A Little Help from another person to put on and taking off regular upper body clothing?: None  Help from another person to put on and taking off regular lower body clothing?: A Little 6 Click Score: 21   End of Session Equipment Utilized During Treatment: Rolling walker (2 wheels)  Activity Tolerance: Patient tolerated treatment well Patient left: in chair;with call bell/phone within reach;with chair alarm set;with family/visitor present  OT Visit Diagnosis: Other abnormalities of gait and mobility (R26.89)                Time: 4098-1191 OT Time Calculation (min): 27 min Charges:  OT General Charges $OT Visit: 1 Visit OT Evaluation $OT Eval Low Complexity: 1 Low OT Treatments $Self Care/Home Management : 8-22 mins Vinnie Gombert, OTR/L 02/07/23, 12:55 PM  [facility] Navjot Pilgrim 02/07/2023, 12:50 PM

## 2023-02-07 NOTE — Progress Notes (Signed)
Patient is not able to walk the distance required to go the bathroom, or he is unable to safely negotiate stairs required to access the bathroom.  A 3in1 BSC will alleviate this problem.        T. Chris Duchess Armendarez, PA-C [facility] 
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
## 2023-02-07 NOTE — TOC Initial Note (Signed)
Transition of Care [facility]) - Initial/Assessment Note    Patient Details  Name: Sergio Gates MRN: [id number] Date of Birth: 21-Mar-1961  Transition of Care [facility]) CM/SW Contact:    Marlowe Sax, RN Phone Number: 02/07/2023, 10:16 AM  Clinical Narrative:                 The patient is set up with [facility] for [facility] prior to surgery by surgeons office, RW and 3 in 1 to be delivered to the bedside prior to dc  Expected Discharge Plan: Home w Home Health Services Barriers to Discharge: No Barriers Identified   Patient Goals and CMS Choice            Expected Discharge Plan and Services   Discharge Planning Services: CM Consult   Living arrangements for the past 2 months: Single Family Home                 DME Arranged: Walker rolling, 3-N-1 DME Agency: [employer]       HH Arranged: PT, OT HH Agency: [facility] ([facility]) Date HH Agency Contacted: 02/07/23 Time HH Agency Contacted: 1016 Representative spoke with at [facility] Agency: Morrie Sheldon  Prior Living Arrangements/Services Living arrangements for the past 2 months: Single Family Home Lives with:: Self Patient language and need for interpreter reviewed:: Yes Do you feel safe going back to the place where you live?: Yes      Need for Family Participation in Patient Care: Yes (Comment) Care giver support system in place?: Yes (comment)   Criminal Activity/Legal Involvement Pertinent to Current Situation/Hospitalization: No - Comment as needed  Activities of Daily Living   ADL Screening (condition at time of admission) Independently performs ADLs?: Yes (appropriate for developmental age) Is the patient deaf or have difficulty hearing?: No Does the patient have difficulty seeing, even when wearing glasses/contacts?: No Does the patient have difficulty concentrating, remembering, or making decisions?: No  Permission Sought/Granted   Permission granted to share information with : Yes, Verbal Permission  Granted              Emotional Assessment Appearance:: Appears stated age   Affect (typically observed): Accepting, Pleasant Orientation: : Oriented to Self, Oriented to Place, Oriented to  Time, Oriented to Situation Alcohol / Substance Use: Not Applicable Psych Involvement: No (comment)  Admission diagnosis:  S/P TKR (total knee replacement), left [Z96.652] Patient Active Problem List   Diagnosis Date Noted   S/P TKR (total knee replacement), left 02/06/2023   Dyspnea on exertion 12/14/2022   Essential hypertension 12/14/2022   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 [place] [address] Phone: [phone] Fax: [phone]  [id number] [place], [place] - [address] AT [facility] OF [address] [facility] [address] [place] [address] Phone: [phone] Fax: [phone]     Social Determinants of Health (SDOH) Social History: SDOH Screenings   Depression (PHQ2-9): Low Risk  (09/23/2021)  Tobacco Use: Medium Risk (02/06/2023)   SDOH Interventions:     Readmission Risk Interventions     No data to display

## 2023-02-07 NOTE — Progress Notes (Signed)
Patient refused to remain in bone foam, educated patient about treatment.

## 2023-02-07 NOTE — Progress Notes (Signed)
Physical Therapy Treatment Patient Details Name: Sergio Gates MRN: [id number] DOB: June 16, 1960 Today's Date: 02/07/2023   History of Present Illness Pt is a 62 yo M diagnosed with left knee osteoarthritis with severe bone loss and valgus deformity and is s/p elective L TKA.  PMH includes: COPD, HTN, asthma, and GERD.    PT Comments  Pt was pleasant and motivated to participate during the session and put forth good effort throughout. Pt continued to require cuing for proper sequencing with functional tasks but was generally steady throughout the session with good concentric control and fair eccentric control with transfers and stair training.  Pt reported no adverse symptoms during the session other than L knee pain with SpO2 and HR WNL on room air.  Pt will benefit from continued PT services upon discharge to safely address deficits listed in patient problem list for decreased caregiver assistance and eventual return to PLOF.       If plan is discharge home, recommend the following: A little help with walking and/or transfers;A little help with bathing/dressing/bathroom;Assistance with cooking/housework;Assist for transportation;Help with stairs or ramp for entrance   Can travel by private vehicle        Equipment Recommendations  Rolling walker (2 wheels);BSC/3in1    Recommendations for Other Services       Precautions / Restrictions Precautions Precautions: Fall Restrictions Weight Bearing Restrictions: Yes LLE Weight Bearing: Weight bearing as tolerated     Mobility  Bed Mobility Overal bed mobility: Modified Independent             General bed mobility comments: Min extra time and effort only    Transfers Overall transfer level: Needs assistance Equipment used: Rolling walker (2 wheels) Transfers: Sit to/from Stand Sit to Stand: Contact guard assist           General transfer comment: Min verbal cues for hand placement with poor carryover     Ambulation/Gait Ambulation/Gait assistance: Contact guard assist Gait Distance (Feet): 100 Feet Assistive device: Rolling walker (2 wheels) Gait Pattern/deviations: Step-to pattern, Step-through pattern, Decreased step length - right, Decreased stance time - left, Antalgic Gait velocity: decreased     General Gait Details: Slow cadence and antalgic on the LLE with step-to pattern gradually progressing to beginning step-through pattern   Stairs Stairs: Yes Stairs assistance: Contact guard assist Stair Management: One rail Left, Step to pattern, Forwards Number of Stairs: 4 General stair comments: Min to mod visual and verbal cues for proper sequencing with good eccentric and concentric control and stability   Wheelchair Mobility     Tilt Bed    Modified Rankin (Stroke Patients Only)       Balance Overall balance assessment: Needs assistance   Sitting balance-Leahy Scale: Normal     Standing balance support: Bilateral upper extremity supported, During functional activity Standing balance-Leahy Scale: Good                              Cognition Arousal: Alert Behavior During Therapy: WFL for tasks assessed/performed Overall Cognitive Status: Within Functional Limits for tasks assessed                                          Exercises Total Joint Exercises Quad Sets: AROM, Strengthening, Left, 5 reps, 10 reps Straight Leg Raises: AROM, Strengthening, Left, 5 reps Long Arc Quad:  AROM, Strengthening, Left, 10 reps, 5 reps Knee Flexion: AROM, Strengthening, Left, 5 reps, 10 reps Goniometric ROM: L knee AROM: 6-91 deg Other Exercises Other Exercises: HEP education/review per handout    General Comments        Pertinent Vitals/Pain Pain Assessment Pain Assessment: 0-10 Pain Score: 7  Pain Location: L knee Pain Descriptors / Indicators: Aching, Sore Pain Intervention(s): Repositioned, Premedicated before session, Monitored  during session, Ice applied    Home Living Family/patient expects to be discharged to:: Private residence Living Arrangements: Children;Other relatives Available Help at Discharge: Family;Available PRN/intermittently Type of Home: Mobile home Home Access: Stairs to enter Entrance Stairs-Rails: Right;Left (too wide for both) Entrance Stairs-Number of Steps: 4   Home Layout: One level Home Equipment: Cane - single point      Prior Function            PT Goals (current goals can now be found in the care plan section) Acute Rehab PT Goals Patient Stated Goal: To walk better without pain PT Goal Formulation: With patient Time For Goal Achievement: 02/20/23 Potential to Achieve Goals: Good Progress towards PT goals: Progressing toward goals    Frequency    BID      PT Plan      Co-evaluation              AM-PAC PT "6 Clicks" Mobility   Outcome Measure  Help needed turning from your back to your side while in a flat bed without using bedrails?: A Little Help needed moving from lying on your back to sitting on the side of a flat bed without using bedrails?: A Little Help needed moving to and from a bed to a chair (including a wheelchair)?: A Little Help needed standing up from a chair using your arms (e.g., wheelchair or bedside chair)?: A Little Help needed to walk in hospital room?: A Little Help needed climbing 3-5 steps with a railing? : A Little 6 Click Score: 18    End of Session Equipment Utilized During Treatment: Gait belt Activity Tolerance: Patient tolerated treatment well Patient left: in bed;with call bell/phone within reach;with bed alarm set;with SCD's reapplied;Other (comment) (polar care donned to L knee) Nurse Communication: Mobility status PT Visit Diagnosis: Other abnormalities of gait and mobility (R26.89);Muscle weakness (generalized) (M62.81);Pain Pain - Right/Left: Left Pain - part of body: Knee     Time: 1610-9604 PT Time  Calculation (min) (ACUTE ONLY): 33 min  Charges:    $Gait Training: 23-37 mins PT General Charges $$ ACUTE PT VISIT: 1 Visit                     D. Scott Tamotsu Wiederholt PT, DPT 02/07/23, 3:54 PM

## 2023-02-07 NOTE — Discharge Instructions (Signed)
Instructions after Total Knee Replacement   Reinaldo Berber M.D.     Dept. of Orthopaedics & Sports Medicine  [facility]  [address]  [place], [place]  [address]  Phone: [phone]   Fax: [phone]    DIET: Drink plenty of non-alcoholic fluids. Resume your normal diet. Include foods high in fiber.  ACTIVITY:  You may use crutches or a walker with weight-bearing as tolerated, unless instructed otherwise. You may be weaned off of the walker or crutches by your Physical Therapist.  Do NOT place pillows under the knee. Anything placed under the knee could limit your ability to straighten the knee.   Continue doing gentle exercises. Exercising will reduce the pain and swelling, increase motion, and prevent muscle weakness.   Please continue to use the TED compression stockings for 2 weeks. You may remove the stockings at night, but should reapply them in the morning. Do not drive or operate any equipment until instructed.  WOUND CARE:  Continue to use the PolarCare or ice packs periodically to reduce pain and swelling. Keep dressing on left lower extremity clean and dry.  We will remove the Ace wrap and bulky dressing at your first postop visit at 7 days postop.  MEDICATIONS: You may resume your regular medications. Please take the pain medication as prescribed on the medication. Do not take pain medication on an empty stomach. You have been given a prescription for a blood thinner (Lovenox or Coumadin). Please take the medication as instructed. (NOTE: After completing a 2 week course of Lovenox, take one 81 mg Enteric-coated aspirin twice a day for 3 additional weeks. This along with elevation will help reduce the possibility of phlebitis in your operated leg.) Do not drive or drink alcoholic beverages when taking pain medications.  POSTOPERATIVE CONSTIPATION PROTOCOL Constipation - defined medically as fewer than three stools per week and severe constipation as  less than one stool per week.  One of the most common issues patients have following surgery is constipation.  Even if you have a regular bowel pattern at home, your normal regimen is likely to be disrupted due to multiple reasons following surgery.  Combination of anesthesia, postoperative narcotics, change in appetite and fluid intake all can affect your bowels.  In order to avoid complications following surgery, here are some recommendations in order to help you during your recovery period.  Colace (docusate) - Pick up an over-the-counter form of Colace or another stool softener and take twice a day as long as you are requiring postoperative pain medications.  Take with a full glass of water daily.  If you experience loose stools or diarrhea, hold the colace until you stool forms back up.  If your symptoms do not get better within 1 week or if they get worse, check with your doctor.  Dulcolax (bisacodyl) - Pick up over-the-counter and take as directed by the product packaging as needed to assist with the movement of your bowels.  Take with a full glass of water.  Use this product as needed if not relieved by Colace only.   MiraLax (polyethylene glycol) - Pick up over-the-counter to have on hand.  MiraLax is a solution that will increase the amount of water in your bowels to assist with bowel movements.  Take as directed and can mix with a glass of water, juice, soda, coffee, or tea.  Take if you go more than two days without a movement. Do not use MiraLax more than once per day. Call  your doctor if you are still constipated or irregular after using this medication for 7 days in a row.  If you continue to have problems with postoperative constipation, please contact the office for further assistance and recommendations.  If you experience "the worst abdominal pain ever" or develop nausea or vomiting, please contact the office immediatly for further recommendations for treatment.   CALL THE OFFICE  FOR: Temperature above 101 degrees Excessive bleeding or drainage on the dressing. Excessive swelling, coldness, or paleness of the toes. Persistent nausea and vomiting.  FOLLOW-UP:  You should have an appointment to return to the office in 7 days after surgery. Arrangements have been made for continuation of Physical Therapy (either home therapy or outpatient therapy).

## 2023-02-07 NOTE — Evaluation (Signed)
Physical Therapy Evaluation Patient Details Name: Sergio Gates MRN: [id number] DOB: 06/12/1960 Today's Date: 02/07/2023  History of Present Illness  Pt is a 62 yo M diagnosed with left knee osteoarthritis with severe bone loss and valgus deformity and is s/p elective L TKA.  PMH includes: COPD, HTN, asthma, and GERD.   Clinical Impression  Pt was pleasant and motivated to participate during the session and put forth good effort throughout. Pt required no physical assistance with functional tasks per below but did require cuing and extra time and effort throughout.  Pt was quite antalgic with step-to pattern during gait training but as the session progressed the pt gradually progressed to beginning step-through pattern with no overt LOB.  Pt reported no adverse symptoms during the session other than mod L knee pain.  Pt will benefit from continued PT services upon discharge to safely address deficits listed in patient problem list for decreased caregiver assistance and eventual return to PLOF.          If plan is discharge home, recommend the following: A little help with walking and/or transfers;A little help with bathing/dressing/bathroom;Assistance with cooking/housework;Assist for transportation;Help with stairs or ramp for entrance   Can travel by private vehicle        Equipment Recommendations Rolling walker (2 wheels);BSC/3in1  Recommendations for Other Services       Functional Status Assessment Patient has had a recent decline in their functional status and demonstrates the ability to make significant improvements in function in a reasonable and predictable amount of time.     Precautions / Restrictions Precautions Precautions: Fall Restrictions Weight Bearing Restrictions: Yes LLE Weight Bearing: Weight bearing as tolerated      Mobility  Bed Mobility Overal bed mobility: Modified Independent             General bed mobility comments: Min extra time and  effort only    Transfers Overall transfer level: Needs assistance Equipment used: Rolling walker (2 wheels) Transfers: Sit to/from Stand Sit to Stand: Contact guard assist           General transfer comment: Min verbal cues for hand and L foot placement    Ambulation/Gait Ambulation/Gait assistance: Contact guard assist Gait Distance (Feet): 125 Feet Assistive device: Rolling walker (2 wheels) Gait Pattern/deviations: Step-to pattern, Step-through pattern, Decreased step length - right, Decreased stance time - left, Antalgic Gait velocity: decreased     General Gait Details: Slow cadence and antalgic on the LLE with step-to pattern gradually progressing to beginning step-through pattern  Stairs            Wheelchair Mobility     Tilt Bed    Modified Rankin (Stroke Patients Only)       Balance Overall balance assessment: Needs assistance   Sitting balance-Leahy Scale: Normal     Standing balance support: Bilateral upper extremity supported, During functional activity Standing balance-Leahy Scale: Good                               Pertinent Vitals/Pain Pain Assessment Pain Assessment: 0-10 Pain Score: 5  Pain Location: L knee Pain Descriptors / Indicators: Aching, Sore Pain Intervention(s): Repositioned, Premedicated before session, Monitored during session, Ice applied    Home Living Family/patient expects to be discharged to:: Private residence Living Arrangements: Children;Other relatives Available Help at Discharge: Family;Available PRN/intermittently Type of Home: Mobile home Home Access: Stairs to enter Entrance Stairs-Rails: Right;Left (too wide for  both) Entrance Stairs-Number of Steps: 4   Home Layout: One level Home Equipment: Cane - single point      Prior Function Prior Level of Function : Independent/Modified Independent             Mobility Comments: Ind amb community distances without an AD, works FT as heavy  Clinical cytogeneticist, 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, started using SPC PRN over the last 1-2 weeks secondary to L knee pain ADLs Comments: Ind with ADLs     Extremity/Trunk Assessment   Upper Extremity Assessment Upper Extremity Assessment: Overall WFL for tasks assessed    Lower Extremity Assessment Lower Extremity Assessment: RLE deficits/detail;LLE deficits/detail RLE Deficits / Details: RLE strength grossly WNL RLE Sensation: WNL RLE Coordination: WNL LLE: Unable to fully assess due to pain LLE Sensation: WNL       Communication   Communication Communication: No apparent difficulties Cueing Techniques: Verbal cues;Tactile cues;Visual cues  Cognition Arousal: Alert Behavior During Therapy: WFL for tasks assessed/performed Overall Cognitive Status: Within Functional Limits for tasks assessed                                          General Comments      Exercises Total Joint Exercises Quad Sets: AROM, Strengthening, Left, 5 reps, 10 reps Straight Leg Raises: AROM, Strengthening, Left, 5 reps Long Arc Quad: AROM, Strengthening, Left, 10 reps, 5 reps Knee Flexion: AROM, Strengthening, Left, 5 reps, 10 reps Goniometric ROM: L knee AROM: 6-91 deg Other Exercises Other Exercises: LLE positioning education to promote knee ext PROM and prevent heel pressure   Assessment/Plan    PT Assessment Patient needs continued PT services  PT Problem List Decreased strength;Decreased range of motion;Decreased balance;Decreased mobility;Decreased knowledge of use of DME;Pain       PT Treatment Interventions DME instruction;Gait training;Stair training;Functional mobility training;Therapeutic activities;Therapeutic exercise;Balance training;Patient/family education    PT Goals (Current goals can be found in the Care Plan section)  Acute Rehab PT Goals Patient Stated Goal: To walk better without pain PT Goal Formulation: With patient Time For Goal Achievement: 02/20/23 Potential to  Achieve Goals: Good    Frequency BID     Co-evaluation               AM-PAC PT "6 Clicks" Mobility  Outcome Measure Help needed turning from your back to your side while in a flat bed without using bedrails?: A Little Help needed moving from lying on your back to sitting on the side of a flat bed without using bedrails?: A Little Help needed moving to and from a bed to a chair (including a wheelchair)?: A Little Help needed standing up from a chair using your arms (e.g., wheelchair or bedside chair)?: A Little Help needed to walk in hospital room?: A Little Help needed climbing 3-5 steps with a railing? : A Little 6 Click Score: 18    End of Session Equipment Utilized During Treatment: Gait belt Activity Tolerance: Patient tolerated treatment well Patient left: in chair;with call bell/phone within reach;with chair alarm set;with SCD's reapplied;Other (comment) (polar care donned to L knee) Nurse Communication: Mobility status PT Visit Diagnosis: Other abnormalities of gait and mobility (R26.89);Muscle weakness (generalized) (M62.81);Pain Pain - Right/Left: Left Pain - part of body: Knee    Time: 0927-1002 PT Time Calculation (min) (ACUTE ONLY): 35 min   Charges:   PT Evaluation $PT Eval Moderate Complexity:  1 Mod PT Treatments $Gait Training: 8-22 mins PT General Charges $$ ACUTE PT VISIT: 1 Visit       D. Elly Modena PT, DPT 02/07/23, 12:13 PM
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
## 2023-02-08 DIAGNOSIS — M1712 Unilateral primary osteoarthritis, left knee: Secondary | ICD-10-CM | POA: Diagnosis not present

## 2023-02-08 LAB — CBC
HCT: 28 % — ABNORMAL LOW (ref 39.0–52.0)
Hemoglobin: 9.5 g/dL — ABNORMAL LOW (ref 13.0–17.0)
MCH: 32.6 pg (ref 26.0–34.0)
MCHC: 33.9 g/dL (ref 30.0–36.0)
MCV: 96.2 fL (ref 80.0–100.0)
Platelets: 357 10*3/uL (ref 150–400)
RBC: 2.91 MIL/uL — ABNORMAL LOW (ref 4.22–5.81)
RDW: 12.7 % (ref 11.5–15.5)
WBC: 10.3 10*3/uL (ref 4.0–10.5)
nRBC: 0 % (ref 0.0–0.2)

## 2023-02-08 NOTE — Plan of Care (Signed)
Progressing towards set goals.

## 2023-02-08 NOTE — Plan of Care (Signed)
  Problem: Education: Goal: Knowledge of General Education information will improve Description: Including pain rating scale, medication(s)/side effects and non-pharmacologic comfort measures Outcome: Progressing   Problem: Health Behavior/Discharge Planning: Goal: Ability to manage health-related needs will improve Outcome: Progressing   Problem: Clinical Measurements: Goal: Ability to maintain clinical measurements within normal limits will improve Outcome: Progressing Goal: Will remain free from infection Outcome: Progressing Goal: Diagnostic test results will improve Outcome: Progressing Goal: Respiratory complications will improve Outcome: Progressing Goal: Cardiovascular complication will be avoided Outcome: Progressing   Problem: Activity: Goal: Risk for activity intolerance will decrease Outcome: Progressing   Problem: Nutrition: Goal: Adequate nutrition will be maintained Outcome: Progressing   Problem: Coping: Goal: Level of anxiety will decrease Outcome: Progressing   Problem: Elimination: Goal: Will not experience complications related to bowel motility Outcome: Progressing Goal: Will not experience complications related to urinary retention Outcome: Progressing   Problem: Pain Management: Goal: General experience of comfort will improve Outcome: Progressing   Problem: Safety: Goal: Ability to remain free from injury will improve Outcome: Progressing   Problem: Skin Integrity: Goal: Risk for impaired skin integrity will decrease Outcome: Progressing   Problem: Education: Goal: Knowledge of the prescribed therapeutic regimen will improve Outcome: Progressing Goal: Individualized Educational Video(s) Outcome: Progressing   Problem: Activity: Goal: Ability to avoid complications of mobility impairment will improve Outcome: Progressing Goal: Range of joint motion will improve Outcome: Progressing   Problem: Clinical Measurements: Goal:  Postoperative complications will be avoided or minimized Outcome: Progressing   Problem: Pain Management: Goal: Pain level will decrease with appropriate interventions Outcome: Progressing   Problem: Skin Integrity: Goal: Will show signs of wound healing Outcome: Progressing

## 2023-02-08 NOTE — Progress Notes (Signed)
Occupational Therapy Treatment Patient Details Name: Sergio Gates MRN: [id number] DOB: [birth year] Today's Date: 02/08/2023   History of present illness Pt is a 62 yo M diagnosed with left knee osteoarthritis with severe bone loss and valgus deformity and is s/p elective L TKA.  PMH includes: COPD, HTN, asthma, and GERD.   OT comments  Pt is supine in bed on arrival. Pleasant and agreeable to OT session. He reports 9/10 pain to L knee, but had just been given pain meds by nursing ~10 mins prior to OT arrival. Provided re-education on polar care and pt demo ability to doff/don ice pack and unhook from polar care. Pt also re-educated on ADL safety and use of AE/AD/DME to improve IND/ease with tasks upon return home. Pt demo bed mobility MOD I and ambulated ~20 feet out into the hallway and back needed CGA/SBA for STS and mobility. Pt returned to bed with all needs in place and will cont to require skilled acute OT services to maximize his safety and IND to return to PLOF.       If plan is discharge home, recommend the following:  A little help with walking and/or transfers;A little help with bathing/dressing/bathroom;Help with stairs or ramp for entrance;Assist for transportation;Assistance with cooking/housework   Equipment Recommendations  None recommended by OT    Recommendations for Other Services      Precautions / Restrictions Restrictions Weight Bearing Restrictions: Yes LLE Weight Bearing: Weight bearing as tolerated       Mobility Bed Mobility Overal bed mobility: Modified Independent                  Transfers Overall transfer level: Needs assistance Equipment used: Rolling walker (2 wheels) Transfers: Sit to/from Stand Sit to Stand: Contact guard assist, Supervision           General transfer comment: CGA to SUP for STS from EOB to recliner then ambulated ~20 feet into hallway and back with CGA/SBA     Balance Overall balance assessment: Needs  assistance   Sitting balance-Leahy Scale: Normal     Standing balance support: Bilateral upper extremity supported, During functional activity Standing balance-Leahy Scale: Good                             ADL either performed or assessed with clinical judgement   ADL                                              Extremity/Trunk Assessment     Lower Extremity Assessment LLE Deficits / Details: s/p L TKR        Vision       Perception     Praxis      Cognition Arousal: Alert Behavior During Therapy: WFL for tasks assessed/performed Overall Cognitive Status: Within Functional Limits for tasks assessed                                          Exercises Other Exercises Other Exercises: Re-educated on polar care system and pt demo back ability to don/doff ice pack. Other Exercises: Re-educated on use of AE/AD/DME upon return home to maximize safety and IND and decrease pain with ADL performance.  Shoulder Instructions       General Comments      Pertinent Vitals/ Pain       Pain Assessment Pain Assessment: 0-10 Pain Score: 9  Pain Location: L knee Pain Descriptors / Indicators: Aching, Sore Pain Intervention(s): Monitored during session, Limited activity within patient's tolerance, Repositioned, Ice applied, Premedicated before session  Home Living                                          Prior Functioning/Environment              Frequency  Min 1X/week        Progress Toward Goals  OT Goals(current goals can now be found in the care plan section)  Progress towards OT goals: Progressing toward goals  Acute Rehab OT Goals Patient Stated Goal: return home OT Goal Formulation: With patient Time For Goal Achievement: 02/21/23 Potential to Achieve Goals: Good  Plan      Co-evaluation                 AM-PAC OT "6 Clicks" Daily Activity     Outcome Measure   Help  from another person eating meals?: None Help from another person taking care of personal grooming?: None Help from another person toileting, which includes using toliet, bedpan, or urinal?: A Little Help from another person bathing (including washing, rinsing, drying)?: A Little Help from another person to put on and taking off regular upper body clothing?: None Help from another person to put on and taking off regular lower body clothing?: A Little 6 Click Score: 21    End of Session Equipment Utilized During Treatment: Rolling walker (2 wheels)  OT Visit Diagnosis: Other abnormalities of gait and mobility (R26.89);Pain Pain - Right/Left: Left Pain - part of body: Knee   Activity Tolerance Patient tolerated treatment well   Patient Left in bed;with call bell/phone within reach   Nurse Communication          Time: 7829-5621 OT Time Calculation (min): 12 min  Charges: OT General Charges $OT Visit: 1 Visit OT Treatments $Therapeutic Activity: 8-22 mins  Dashton Czerwinski, OTR/L  02/08/23, 11:06 AM   Tiphanie Vo E Netra Postlethwait 02/08/2023, 11:02 AM
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
## 2023-02-08 NOTE — Progress Notes (Signed)
Physical Therapy Treatment Patient Details Name: Sergio Gates MRN: [id number] DOB: 1961-01-20 Today's Date: 02/08/2023   History of Present Illness Pt is a 62 yo M diagnosed with left knee osteoarthritis with severe bone loss and valgus deformity and is s/p elective L TKA.  PMH includes: COPD, HTN, asthma, and GERD.    PT Comments  Pt was pleasant and motivated to participate during the session and put forth good effort throughout although pt reported feeling more limited this session by a combination of L knee pain and general fatigue.  Pt required occasional min A with transfers that improved with heavy cuing for proper sequencing.  Pt was only able to amb a max of around 60 feet before needing to have a seated rest break and was able to follow that up with two shorted walks.  Mod verbal cues given for decreased WB through the arms on the RW during gait with more upright posture. Pt was generally steady with amb with slow cadence and no overt LOB but did require min A on one occasion for stability while descending steps.  Pt's SpO2 and HR were both WNL on room air during the session.  Pt advised on the benefit of family or friend supervision at home with patient giving the ok to speak to his daughter to see if she could help find assistance.  Spoke to daughter who understood recommendation for supervision at home and daughter stated would work to try to find help.  Pt will benefit from continued PT services upon discharge to safely address deficits listed in patient problem list for decreased caregiver assistance and eventual return to PLOF.        If plan is discharge home, recommend the following: A little help with walking and/or transfers;A little help with bathing/dressing/bathroom;Assistance with cooking/housework;Assist for transportation;Help with stairs or ramp for entrance   Can travel by private vehicle        Equipment Recommendations  Rolling walker (2 wheels);BSC/3in1     Recommendations for Other Services       Precautions / Restrictions Precautions Precautions: Fall Restrictions Weight Bearing Restrictions: Yes LLE Weight Bearing: Weight bearing as tolerated     Mobility  Bed Mobility Overal bed mobility: Modified Independent             General bed mobility comments: Min extra time and effort only    Transfers Overall transfer level: Needs assistance Equipment used: Rolling walker (2 wheels) Transfers: Sit to/from Stand Sit to Stand: Contact guard assist, Min assist           General transfer comment: Pt required min A to come to full standing on two occasions but with extra cuing for proper sequencing was able to come to standing with CGA on remaining transfers    Ambulation/Gait Ambulation/Gait assistance: Contact guard assist Gait Distance (Feet): 60 Feet x 1, 30 Feet x 2 Assistive device: Rolling walker (2 wheels) Gait Pattern/deviations: Step-to pattern, Step-through pattern, Decreased step length - right, Decreased stance time - left, Antalgic Gait velocity: decreased     General Gait Details: Slow cadence and antalgic on the LLE with step-to pattern gradually progressing to beginning step-through pattern   Stairs Stairs: Yes Stairs assistance: Contact guard assist, Min assist Stair Management: One rail Left, Step to pattern, Forwards Number of Stairs: 4 General stair comments: Min verbal cues for sequencing and min A for general stability while descending one of the 4 steps   Wheelchair Mobility     Tilt Bed  Modified Rankin (Stroke Patients Only)       Balance Overall balance assessment: Needs assistance   Sitting balance-Leahy Scale: Normal     Standing balance support: Bilateral upper extremity supported, During functional activity Standing balance-Leahy Scale: Fair                              Cognition Arousal: Alert Behavior During Therapy: WFL for tasks  assessed/performed Overall Cognitive Status: Within Functional Limits for tasks assessed                                          Exercises Total Joint Exercises Quad Sets: AROM, Strengthening, Left, 5 reps, 10 reps Long Arc Quad: AROM, Strengthening, Left, 10 reps, 5 reps Knee Flexion: AROM, Strengthening, Left, 5 reps, 10 reps Goniometric ROM: L knee AROM: 5-90 deg Other Exercises Other Exercises: Car transfer sequencing education    General Comments        Pertinent Vitals/Pain Pain Assessment Pain Assessment: 0-10 Pain Score: 6  Pain Location: L knee Pain Descriptors / Indicators: Aching, Sore Pain Intervention(s): Monitored during session, Premedicated before session, Repositioned, Ice applied    Home Living                          Prior Function            PT Goals (current goals can now be found in the care plan section) Progress towards PT goals: Not progressing toward goals - comment (limited by pain and fatigue this session)    Frequency    BID      PT Plan      Co-evaluation              AM-PAC PT "6 Clicks" Mobility   Outcome Measure  Help needed turning from your back to your side while in a flat bed without using bedrails?: A Little Help needed moving from lying on your back to sitting on the side of a flat bed without using bedrails?: A Little Help needed moving to and from a bed to a chair (including a wheelchair)?: A Little Help needed standing up from a chair using your arms (e.g., wheelchair or bedside chair)?: A Little Help needed to walk in hospital room?: A Little Help needed climbing 3-5 steps with a railing? : A Little 6 Click Score: 18    End of Session Equipment Utilized During Treatment: Gait belt Activity Tolerance: Patient limited by fatigue;Patient limited by pain Patient left: in chair;with call bell/phone within reach;with chair alarm set;with SCD's reapplied;Other (comment) (polar care  donned to L knee) Nurse Communication: Mobility status PT Visit Diagnosis: Other abnormalities of gait and mobility (R26.89);Muscle weakness (generalized) (M62.81);Pain Pain - Right/Left: Left Pain - part of body: Knee     Time: 9629-5284 PT Time Calculation (min) (ACUTE ONLY): 45 min  Charges:    $Gait Training: 8-22 mins $Therapeutic Activity: 8-22 mins PT General Charges $$ ACUTE PT VISIT: 1 Visit                    D. Scott Torence Palmeri PT, DPT 02/08/23, 12:07 PM

## 2023-02-13 ENCOUNTER — Encounter: Payer: Self-pay | Admitting: Internal Medicine

## 2023-06-07 ENCOUNTER — Ambulatory Visit: Payer: Self-pay | Attending: Internal Medicine | Admitting: Internal Medicine

## 2023-06-07 NOTE — Progress Notes (Deleted)
  Cardiology Office Note:  .   Date:  06/07/2023  ID:  Sergio Gates, DOB Nov 01, 1960, MRN [id number] PCP: Armando Gang, FNP  [facility] Providers Cardiologist:  Yvonne Kendall, MD { Click to update primary MD,subspecialty MD or APP then REFRESH:1}    History of Present Illness: .   Sergio Gates is a 63 y.o. male with history of hypertension, hyperlipidemia, carotid artery stenosis, rheumatoid arthritis, and hepatitis C, who presents for follow-up of dyspnea on exertion.  I met him in 11/2022, at which time he complained of dyspnea on exertion.  He was also concerned about "plaque building up on the back of his heart."  We agreed to obtain a pharmacologic myocardial perfusion stress test that was without evidence of ischemia or scar though coronary artery calcification and aortic atherosclerosis were noted.  ROS: See HPI  Studies Reviewed: Marland Kitchen        Pharmacologic MPI (02/01/2023): Low risk study without evidence of ischemia or scar.  LVEF 60%.  Aortic atherosclerosis and coronary artery calcification present.  Outside echo (unknown date): EF 55%, LVH, diastolic dysfunction, mild TR.   Carotid Doppler (unknown date): 40-59% stenosis bilaterally.  Risk Assessment/Calculations:   {Does this patient have ATRIAL FIBRILLATION?:[phone]} No BP recorded.  {Refresh Note OR Click here to enter BP  :1}***       Physical Exam:   VS:  There were no vitals taken for this visit.   Wt Readings from Last 3 Encounters:  02/06/23 128 lb 1.4 oz (58.1 kg)  02/02/23 128 lb 1.4 oz (58.1 kg)  12/14/22 125 lb 6 oz (56.9 kg)    General:  NAD. Neck: No JVD or HJR. Lungs: Clear to auscultation bilaterally without wheezes or crackles. Heart: Regular rate and rhythm without murmurs, rubs, or gallops. Abdomen: Soft, nontender, nondistended. Extremities: No lower extremity edema.  ASSESSMENT AND PLAN: .    ***    {Are you ordering a CV Procedure (e.g. stress test, cath, DCCV, TEE, etc)?    Press F2        :[id number]}  Dispo: ***  Signed, Yvonne Kendall, MD
